# Patient Record
Sex: Female | Born: 1940
Health system: Southern US, Community
[De-identification: ages and names within clinical notes are randomized; demographics above are authoritative.]

## PROBLEM LIST (undated history)

## (undated) DIAGNOSIS — M069 Rheumatoid arthritis, unspecified: Secondary | ICD-10-CM

## (undated) HISTORY — DX: Rheumatoid arthritis, unspecified: M06.9

---

## 1972-07-10 HISTORY — PX: NASAL SEPTUM SURGERY: SHX37

## 1999-07-22 ENCOUNTER — Encounter: Admission: RE | Admit: 1999-07-22 | Discharge: 1999-07-22 | Payer: Self-pay | Admitting: Emergency Medicine

## 1999-07-22 ENCOUNTER — Encounter: Payer: Self-pay | Admitting: Emergency Medicine

## 2000-05-25 ENCOUNTER — Encounter: Admission: RE | Admit: 2000-05-25 | Discharge: 2000-06-28 | Payer: Self-pay | Admitting: Rheumatology

## 2000-08-24 ENCOUNTER — Encounter: Payer: Self-pay | Admitting: Family Medicine

## 2000-08-24 ENCOUNTER — Encounter: Admission: RE | Admit: 2000-08-24 | Discharge: 2000-08-24 | Payer: Self-pay | Admitting: Family Medicine

## 2001-08-16 ENCOUNTER — Encounter: Admission: RE | Admit: 2001-08-16 | Discharge: 2001-08-16 | Payer: Self-pay | Admitting: Rheumatology

## 2001-08-16 ENCOUNTER — Encounter: Payer: Self-pay | Admitting: Rheumatology

## 2001-09-03 ENCOUNTER — Encounter: Payer: Self-pay | Admitting: Family Medicine

## 2001-09-03 ENCOUNTER — Encounter: Admission: RE | Admit: 2001-09-03 | Discharge: 2001-09-03 | Payer: Self-pay | Admitting: *Deleted

## 2002-09-10 ENCOUNTER — Encounter: Admission: RE | Admit: 2002-09-10 | Discharge: 2002-09-10 | Payer: Self-pay | Admitting: Family Medicine

## 2002-09-10 ENCOUNTER — Encounter: Payer: Self-pay | Admitting: Family Medicine

## 2003-10-13 ENCOUNTER — Encounter: Admission: RE | Admit: 2003-10-13 | Discharge: 2003-10-13 | Payer: Self-pay | Admitting: Family Medicine

## 2004-11-04 ENCOUNTER — Encounter: Admission: RE | Admit: 2004-11-04 | Discharge: 2004-11-04 | Payer: Self-pay | Admitting: Family Medicine

## 2005-12-14 ENCOUNTER — Encounter: Admission: RE | Admit: 2005-12-14 | Discharge: 2005-12-14 | Payer: Self-pay | Admitting: Family Medicine

## 2006-12-13 ENCOUNTER — Ambulatory Visit (HOSPITAL_BASED_OUTPATIENT_CLINIC_OR_DEPARTMENT_OTHER): Admission: RE | Admit: 2006-12-13 | Discharge: 2006-12-13 | Payer: Self-pay | Admitting: Orthopedic Surgery

## 2007-01-16 ENCOUNTER — Encounter: Admission: RE | Admit: 2007-01-16 | Discharge: 2007-01-16 | Payer: Self-pay | Admitting: Family Medicine

## 2008-02-20 ENCOUNTER — Encounter: Admission: RE | Admit: 2008-02-20 | Discharge: 2008-02-20 | Payer: Self-pay | Admitting: Rheumatology

## 2008-03-31 ENCOUNTER — Encounter: Admission: RE | Admit: 2008-03-31 | Discharge: 2008-03-31 | Payer: Self-pay | Admitting: Family Medicine

## 2009-05-05 ENCOUNTER — Encounter: Admission: RE | Admit: 2009-05-05 | Discharge: 2009-05-05 | Payer: Self-pay | Admitting: Family Medicine

## 2010-05-19 ENCOUNTER — Encounter: Admission: RE | Admit: 2010-05-19 | Discharge: 2010-05-19 | Payer: Self-pay | Admitting: Family Medicine

## 2010-11-22 NOTE — Op Note (Signed)
Doris Orr, Doris Orr                  ACCOUNT NO.:  [id number]   MEDICAL RECORD NO.:  [id number]          PATIENT TYPE:  AMB   LOCATION:  DSC                          FACILITY:  [facility]   PHYSICIAN:  Cindee Salt, M.D.       DATE OF BIRTH:  Feb 10, 1941   DATE OF PROCEDURE:  12/13/2006  DATE OF DISCHARGE:                               OPERATIVE REPORT   PREOPERATIVE DIAGNOSIS:  Laceration of the left palm with numbness and  tingling, ring and little finger left hand.   POSTOPERATIVE DIAGNOSIS:  Laceration of the left palm with numbness and  tingling, ring and little finger left hand.   OPERATION:  Exploration neurolysis using the operative microscope with  Neuroflex wrap; common digital nerve middle, ring fingers; proper  digital nerve middle ring finger left hand.   SURGEON:  Cindee Salt, M.D.   ASSISTANT:  Carolyne Fiscal R.N.   ANESTHESIA:  General.   DATE OF OPERATION:  December 13, 2006.   HISTORY:  The patient is a 70 year old female who suffered a laceration  to her left hand from dog bite.  She has had progressive numbness and  tingling which has not abated.  The wound has healed.  She is admitted  now for exploration, possible repair, grafting neurolysis, common  digital nerve, proper digital nerve, middle ring finger left hand.  She  is aware of risks and complications including the possibility of  infection, recurrence, injury to arteries, nerves, tendons, incomplete  relief of symptoms, dystrophy.  She has elect to proceed to have this  done.   PROCEDURE:  The patient is brought to the operating room where a general  anesthetic was carried out without difficulty.  This was done after the  patient was seen in the preoperative area.  The extremity marked by both  the patient and surgeon, and an antibiotic given.  Once draped, the limb  was exsanguinated with an Esmarch bandage, tourniquet placed high on the  upper arm.  It was inflated to 150 mmHg.  The wound was then opened  using the  old scar, extended proximally and distally in a Brunner type  incision, carried down through subcutaneous tissue.  Bleeders were  electrocauterized.  The digital nerve was identified flexed distally.  With blunt and sharp dissection this was dissected free.  A tremendous  amount of scar was present about the nerve.  The operative microscope  was brought into position and the nerve was dissected free.  The digital  artery was found to be intact.  The digital nerve was found to be intact  without a laceration but significant scarring present about the entire  nerve.  The nerve was followed to the proper digital nerves to the  middle ring finger over approximately a 2-cm distance.  This was  entirely freed and an epineurolysis performed using the operative  microscope.  It was decided to wrap this due to the amount of scar  formation.  A Neuroflex tube was then selected.  This was measured be 4  mm.  A 4-mm tube was then selected.  This was then cut with 2 limbs  distally.  After irrigation of the wound, after soaking the neural tube,  this was then wrapped around the proper and common digital nerves.  It  was not sutured into position but with fibrin clot maintained its  circular nature.  Following this, the skin was then closed with  interrupted 5-0 Vicryl Rapide  sutures.  Sterile compressive dressing and splint was applied.  The  patient tolerated the procedure well.  On deflation of the tourniquet  all fingers immediately pinked.  She was taken to the recovery for  observation in satisfactory condition.  She will be discharged home to  return to the [facility] in 1 week.  On Vicodin.           ______________________________  Cindee Salt, M.D.     GK/MEDQ  D:  12/13/2006  T:  12/13/2006  Job:  [id number]   cc:   Lucila Maine

## 2011-04-27 LAB — POCT HEMOGLOBIN-HEMACUE
Hemoglobin: 13
Operator id: 128471

## 2011-05-22 ENCOUNTER — Other Ambulatory Visit: Payer: Self-pay | Admitting: Family Medicine

## 2011-05-22 DIAGNOSIS — Z1231 Encounter for screening mammogram for malignant neoplasm of breast: Secondary | ICD-10-CM

## 2011-06-27 ENCOUNTER — Ambulatory Visit: Payer: Self-pay

## 2011-06-29 ENCOUNTER — Ambulatory Visit
Admission: RE | Admit: 2011-06-29 | Discharge: 2011-06-29 | Disposition: A | Discharge status: Home / Self Care | Payer: Medicare Other | Source: Ambulatory Visit | Attending: Family Medicine | Admitting: Family Medicine

## 2011-06-29 DIAGNOSIS — Z1231 Encounter for screening mammogram for malignant neoplasm of breast: Secondary | ICD-10-CM

## 2012-06-21 ENCOUNTER — Other Ambulatory Visit: Payer: Self-pay | Admitting: Family Medicine

## 2012-06-21 DIAGNOSIS — Z1231 Encounter for screening mammogram for malignant neoplasm of breast: Secondary | ICD-10-CM

## 2012-07-29 ENCOUNTER — Ambulatory Visit
Admission: RE | Admit: 2012-07-29 | Discharge: 2012-07-29 | Disposition: A | Discharge status: Home / Self Care | Payer: Medicare Other | Source: Ambulatory Visit | Attending: Family Medicine | Admitting: Family Medicine

## 2012-07-29 DIAGNOSIS — Z1231 Encounter for screening mammogram for malignant neoplasm of breast: Secondary | ICD-10-CM

## 2012-07-31 ENCOUNTER — Ambulatory Visit: Payer: Medicare Other

## 2013-07-21 ENCOUNTER — Other Ambulatory Visit: Payer: Self-pay

## 2013-07-21 DIAGNOSIS — Z1231 Encounter for screening mammogram for malignant neoplasm of breast: Secondary | ICD-10-CM

## 2013-07-31 ENCOUNTER — Ambulatory Visit
Admission: RE | Admit: 2013-07-31 | Discharge: 2013-07-31 | Disposition: A | Discharge status: Home / Self Care | Payer: 59 | Source: Ambulatory Visit

## 2013-07-31 DIAGNOSIS — Z1231 Encounter for screening mammogram for malignant neoplasm of breast: Secondary | ICD-10-CM

## 2013-08-01 ENCOUNTER — Other Ambulatory Visit: Payer: Self-pay | Admitting: Family Medicine

## 2013-08-01 DIAGNOSIS — R928 Other abnormal and inconclusive findings on diagnostic imaging of breast: Secondary | ICD-10-CM

## 2013-08-12 ENCOUNTER — Ambulatory Visit
Admission: RE | Admit: 2013-08-12 | Discharge: 2013-08-12 | Disposition: A | Discharge status: Home / Self Care | Payer: 59 | Source: Ambulatory Visit | Attending: Family Medicine | Admitting: Family Medicine

## 2013-08-12 DIAGNOSIS — R928 Other abnormal and inconclusive findings on diagnostic imaging of breast: Secondary | ICD-10-CM

## 2013-12-25 ENCOUNTER — Other Ambulatory Visit: Payer: Self-pay | Admitting: Family Medicine

## 2013-12-25 DIAGNOSIS — R921 Mammographic calcification found on diagnostic imaging of breast: Secondary | ICD-10-CM

## 2014-02-07 DEATH — deceased

## 2014-02-11 ENCOUNTER — Ambulatory Visit
Admission: RE | Admit: 2014-02-11 | Discharge: 2014-02-11 | Disposition: A | Discharge status: Home / Self Care | Payer: 59 | Source: Ambulatory Visit | Attending: Family Medicine | Admitting: Family Medicine

## 2014-02-11 DIAGNOSIS — R921 Mammographic calcification found on diagnostic imaging of breast: Secondary | ICD-10-CM

## 2014-07-21 ENCOUNTER — Other Ambulatory Visit: Payer: Self-pay | Admitting: Family Medicine

## 2014-07-21 DIAGNOSIS — R921 Mammographic calcification found on diagnostic imaging of breast: Secondary | ICD-10-CM

## 2014-08-19 ENCOUNTER — Ambulatory Visit
Admission: RE | Admit: 2014-08-19 | Discharge: 2014-08-19 | Disposition: A | Discharge status: Home / Self Care | Payer: Medicare Other | Source: Ambulatory Visit | Attending: Family Medicine | Admitting: Family Medicine

## 2014-08-19 DIAGNOSIS — R921 Mammographic calcification found on diagnostic imaging of breast: Secondary | ICD-10-CM

## 2014-11-16 LAB — PULMONARY FUNCTION TEST

## 2014-12-04 ENCOUNTER — Encounter: Payer: Self-pay | Admitting: Internal Medicine

## 2014-12-04 ENCOUNTER — Ambulatory Visit (INDEPENDENT_AMBULATORY_CARE_PROVIDER_SITE_OTHER): Payer: Medicare Other | Admitting: Internal Medicine

## 2014-12-04 ENCOUNTER — Encounter (INDEPENDENT_AMBULATORY_CARE_PROVIDER_SITE_OTHER): Payer: Self-pay

## 2014-12-04 VITALS — BP 122/74 | HR 89 | Ht 66.0 in | Wt 164.0 lb

## 2014-12-04 DIAGNOSIS — R05 Cough: Secondary | ICD-10-CM | POA: Diagnosis not present

## 2014-12-04 DIAGNOSIS — M0510 Rheumatoid lung disease with rheumatoid arthritis of unspecified site: Secondary | ICD-10-CM | POA: Diagnosis not present

## 2014-12-04 DIAGNOSIS — J4530 Mild persistent asthma, uncomplicated: Secondary | ICD-10-CM | POA: Diagnosis not present

## 2014-12-04 DIAGNOSIS — R059 Cough, unspecified: Secondary | ICD-10-CM

## 2014-12-04 NOTE — Progress Notes (Signed)
Subjective:    Patient ID: Doris ShearerRuth B Orr, female    DOB: 10-Nov-1940,    MRN: [id number]  HPI  [age] yowf never regular smoker with seasonal allergic rhinitis since early 80's prev on shots but no inhalers  dx with RA 1998 f/u by Tr[facility]low on mtx added around 2014 and newly dx'd as asthma by Dr Lorin PicketScott 11/16/14 rx with symbicort and referred 12/04/2014 to pulmonary clinic.   12/04/2014 1st [facility] office visit/ Frenchie Pribyl   Chief Complaint  Patient presents with  . Pulmonary Consult    Referred by Dr. Lucila Maineobert Scott. Pt c/o wheezing, SOB and cough for the past 3 months. She states that it started out with sinus problems. Her cough is non prod. She is SOB walking approx 10 steps.  has prednisone uses about every 3 months for RA flare > takes  just a few pills x a few days and jittery so minimizes it Feels symbicort helps but also causes jitteriness  Walking a mile slow pace ok but doe some worse on hills   No obvious day to day or daytime variabilty or assoc cp or chest tightness,  overt sinus or hb symptoms. No unusual exp hx or h/o childhood pna/ asthma or knowledge of premature birth.  Sleeping ok without nocturnal  or early am exacerbation  of respiratory  c/o's or need for noct saba. Also denies any obvious fluctuation of symptoms with weather or environmental changes or other aggravating or alleviating factors except as outlined above   Current Medications, Allergies, Complete Past Medical History, Past Surgical History, Family History, and Social History were reviewed in [facility] electronic medical record.           Review of Systems  Constitutional: Negative for fever, chills and unexpected weight change.  HENT: Positive for congestion and sneezing. Negative for dental problem, ear pain, nosebleeds, postnasal drip, rhinorrhea, sinus pressure, sore throat, trouble swallowing and voice change.   Eyes: Negative for visual disturbance.  Respiratory: Positive for cough and shortness  of breath. Negative for choking.   Cardiovascular: Negative for chest pain and leg swelling.  Gastrointestinal: Positive for abdominal pain. Negative for vomiting and diarrhea.  Genitourinary: Negative for difficulty urinating.       Acid heartburn Indigestion  Musculoskeletal: Positive for arthralgias.  Skin: Negative for rash.  Neurological: Positive for headaches. Negative for tremors and syncope.  Hematological: Does not bruise/bleed easily.       Objective:   Physical Exam   amb pleasant wf nad  Wt Readings from Last 3 Encounters:  12/04/14 164 lb (74.39 kg)    Vital signs reviewed   HEENT: nl dentition, turbinates, and orophanx. Nl external ear canals without cough reflex   NECK :  without JVD/Nodes/TM/ nl carotid upstrokes bilaterally   LUNGS: no acc muscle use, clear to A and P bilaterally without cough on insp or exp maneuvers   CV:  RRR  no s3 or murmur or increase in P2, no edema   ABD:  soft and nontender with nl excursion in the supine position. No bruits or organomegaly, bowel sounds nl  MS:  warm without deformities, calf tenderness, cyanosis or clubbing  SKIN: warm and dry without lesions    NEURO:  alert, approp, no deficits      I personally reviewed images and agree with radiology impression as follows:  CXR:  Nov 16 2014 Hyperexpansion, min ild vs prev study 04/28/13         Assessment &  Plan:

## 2014-12-04 NOTE — Patient Instructions (Addendum)
Try symbicort 160 just  one twice daily   Work on inhaler technique:  relax and gently blow all the way out then take a nice smooth deep breath back in, triggering the inhaler at same time you start breathing in.  Hold for up to 5 seconds if you can.  Rinse and gargle with water when done     Try pepcid 20 mg at bedtime  GERD (REFLUX)  is an extremely common cause of respiratory symptoms just like yours , many times with no obvious heartburn at all.    It can be treated with medication, but also with lifestyle changes including avoidance of late meals, elevation of the head of your bed (ideally with 6 inch  bed blocks) excessive alcohol, smoking cessation, and avoid fatty foods, chocolate, peppermint, colas, red wine, and acidic juices such as orange juice.  NO MINT OR MENTHOL PRODUCTS SO NO COUGH DROPS  USE SUGARLESS CANDY INSTEAD (Jolley ranchers or Stover's or Life Savers) or even ice chips will also do - the key is to swallow to prevent all throat clearing. NO OIL BASED VITAMINS - use powdered substitutes.   Please schedule a follow up office visit in 6 weeks, call sooner if needed with full pft and no symbicort that am

## 2014-12-06 ENCOUNTER — Encounter: Payer: Self-pay | Admitting: Internal Medicine

## 2014-12-06 DIAGNOSIS — J4530 Mild persistent asthma, uncomplicated: Secondary | ICD-10-CM | POA: Insufficient documentation

## 2014-12-06 DIAGNOSIS — M0510 Rheumatoid lung disease with rheumatoid arthritis of unspecified site: Secondary | ICD-10-CM | POA: Insufficient documentation

## 2014-12-06 DIAGNOSIS — R05 Cough: Secondary | ICD-10-CM | POA: Insufficient documentation

## 2014-12-06 DIAGNOSIS — R059 Cough, unspecified: Secondary | ICD-10-CM | POA: Insufficient documentation

## 2014-12-06 NOTE — Assessment & Plan Note (Signed)
DDX of  difficult airways management all start with A and  include Adherence, Ace Inhibitors, Acid Reflux, Active Sinus Disease, Alpha 1 Antitripsin deficiency, Anxiety masquerading as Airways dz,  ABPA,  allergy(esp in young), Aspiration (esp in elderly), Adverse effects of DPI,  Active smokers, plus two Bs  = Bronchiectasis and Beta blocker use..and one C= CHF   Adherence is always the initial "prime suspect" and is a multilayered concern that requires a "trust but verify" approach in every patient - starting with knowing how to use medications, especially inhalers, correctly, keeping up with refills and understanding the fundamental difference between maintenance and prns vs those medications only taken for a very short course and then stopped and not refilled.  - The proper method of use, as well as anticipated side effects, of a metered-dose inhaler are discussed and demonstrated to the patient. Improved effectiveness after extensive coaching during this visit to a level of approximately  75% > try symbicort 160 one bid before returns for pfts   ? Acid (or non-acid) GERD > always difficult to exclude as up to 75% of pts in some series report no assoc GI/ Heartburn symptoms> rec max (24h)  acid suppression and diet restrictions/ reviewed and instructions given in writing.

## 2014-12-06 NOTE — Assessment & Plan Note (Signed)
DDx for pulmonary fibrosis  includes idiopathic pulmonary fibrosis, pulmonary fibrosis associated with rheumatologic diseases (which have a relatively benign course in most cases) , adverse effect from  drugs such as chemotherapy (like methotrexate) or amiodarone exposure, nonspecific interstitial pneumonia which is typically steroid responsive, and chronic hypersensitivity pneumonitis.   In active  smokers Langerhan's Cell  Histiocyctosis (eosinophilic granuomatosis),  DIP,  and Respiratory Bronchiolitis ILD also need to be considered,  But do not apply here by her hx  Discussed with her at length how difficult it is to tell some RA diseases from the meds used to treat them but the general rule is the better controlled the  arthritis is the less likely we're dealing with RA lung - her RA requires her to take prednisone intermittently so it's not under great control on mtx.  However, whatever is causing the increased pf on cxr, it's not that bad presently and Use of PPI is associated with improved survival time and with decreased radiologic fibrosis per King's study published in [facility] vol 184 p1390.  Dec 2011  This may not be cause and effect, but given how universally unimpressive and expensive  all the other  Drugs developed to day  have been for pf,   rec start  rx max acid suppression  / diet/ lifestyle modification and f/u with serial walking sats and lung volumes for now to put more points on the curve / establish firm baseline before considering additional measures.

## 2014-12-06 NOTE — Assessment & Plan Note (Signed)
Has non specific features so could be due to airways dz(asthma or RA airways dz) or ILD or unrelated (due to gerd)   rx for gerd and airways dz and follow for now  See instructions for specific recommendations which were reviewed directly with the patient who was given a copy with highlighter outlining the key components.

## 2015-01-04 ENCOUNTER — Other Ambulatory Visit: Payer: Self-pay

## 2015-01-15 ENCOUNTER — Encounter: Payer: Self-pay | Admitting: Internal Medicine

## 2015-01-15 ENCOUNTER — Ambulatory Visit (INDEPENDENT_AMBULATORY_CARE_PROVIDER_SITE_OTHER): Payer: Medicare Other | Admitting: Internal Medicine

## 2015-01-15 ENCOUNTER — Ambulatory Visit (INDEPENDENT_AMBULATORY_CARE_PROVIDER_SITE_OTHER)
Admission: RE | Admit: 2015-01-15 | Discharge: 2015-01-15 | Disposition: A | Discharge status: Home / Self Care | Payer: Medicare Other | Source: Ambulatory Visit | Attending: Internal Medicine | Admitting: Internal Medicine

## 2015-01-15 VITALS — BP 118/80 | HR 86 | Ht 67.0 in | Wt 167.0 lb

## 2015-01-15 DIAGNOSIS — R059 Cough, unspecified: Secondary | ICD-10-CM

## 2015-01-15 DIAGNOSIS — R05 Cough: Secondary | ICD-10-CM

## 2015-01-15 DIAGNOSIS — J4530 Mild persistent asthma, uncomplicated: Secondary | ICD-10-CM

## 2015-01-15 DIAGNOSIS — M0510 Rheumatoid lung disease with rheumatoid arthritis of unspecified site: Secondary | ICD-10-CM

## 2015-01-15 LAB — PULMONARY FUNCTION TEST
DL/VA % pred: 91 %
DL/VA: 4.7 ml/min/mmHg/L
DLCO UNC % PRED: 69 %
DLCO UNC: 19.73 ml/min/mmHg
FEF 25-75 POST: 2.03 L/s
FEF 25-75 Pre: 1.6 L/sec
FEF2575-%Change-Post: 26 %
FEF2575-%Pred-Post: 101 %
FEF2575-%Pred-Pre: 80 %
FEV1-%CHANGE-POST: 4 %
FEV1-%PRED-PRE: 81 %
FEV1-%Pred-Post: 85 %
FEV1-Post: 2.13 L
FEV1-Pre: 2.04 L
FEV1FVC-%Change-Post: 8 %
FEV1FVC-%Pred-Pre: 99 %
FEV6-%Change-Post: -4 %
FEV6-%Pred-Post: 81 %
FEV6-%Pred-Pre: 84 %
FEV6-PRE: 2.69 L
FEV6-Post: 2.57 L
FEV6FVC-%CHANGE-POST: 0 %
FEV6FVC-%Pred-Post: 103 %
FEV6FVC-%Pred-Pre: 104 %
FVC-%Change-Post: -3 %
FVC-%Pred-Post: 78 %
FVC-%Pred-Pre: 82 %
FVC-POST: 2.61 L
FVC-Pre: 2.72 L
POST FEV1/FVC RATIO: 82 %
Post FEV6/FVC ratio: 99 %
Pre FEV1/FVC ratio: 75 %
Pre FEV6/FVC Ratio: 99 %
RV % PRED: 103 %
RV: 2.46 L
TLC % PRED: 94 %
TLC: 5.2 L

## 2015-01-15 NOTE — Progress Notes (Signed)
Subjective:    Patient ID: Doris Orr, female    DOB: 1940-08-24,    MRN: [id number]    Brief patient profile:  11 yowf never regular smoker with seasonal allergic rhinitis since early 80's prev on shots but no inhalers  dx with RA 1998 f/u by [facility] on mtx added around 2014 and newly dx'd as asthma by Dr Lorin Picket 11/16/14 rx with symbicort and referred 12/04/2014 to pulmonary clinic. Nl pfts 01/15/15 except for dlco 69%     History of Present Illness  12/04/2014 1st  Pulmonary office visit/ Ithan Touhey   Chief Complaint  Patient presents with  . Pulmonary Consult    Referred by Dr. Lucila Maine. Pt c/o wheezing, SOB and cough for the past 3 months. She states that it started out with sinus problems. Her cough is non prod. She is SOB walking approx 10 steps.  has prednisone uses about every 3 months for RA flare > takes  just a few pills x a few days and jittery so minimizes it Feels symbicort helps but also causes jitteriness rec Try symbicort 160 just  one twice daily  Work on inhaler technique: Try pepcid 20 mg at bedtime GERD diet    01/15/2015 f/u ov/Kofi Murrell re: RA/ on mtx/ ? Asthma but no longer using symbicort makes her too shaky even at one puff bid/ no need for saba  Chief Complaint  Patient presents with  . Follow-up    PFT done today. Pt states that her cough has improved some. She states her SOB has resolved. She states that she can still "feel a little rattle in chest" when she takes a deep breath.   Not limited by breathing from desired activities  Walks around the property and some hiking in [place] s sob    No obvious day to day or daytime variability or assoc chronic cough or cp or chest tightness, subjective wheeze or overt sinus or hb symptoms. No unusual exp hx or h/o childhood pna/ asthma or knowledge of premature birth.  Sleeping ok without nocturnal  or early am exacerbation  of respiratory  c/o's or need for noct saba. Also denies any obvious fluctuation of symptoms  with weather or environmental changes or other aggravating or alleviating factors except as outlined above   Current Medications, Allergies, Complete Past Medical History, Past Surgical History, Family History, and Social History were reviewed in [employer] record.  ROS  The following are not active complaints unless bolded sore throat, dysphagia, dental problems, itching, sneezing,  nasal congestion or excess/ purulent secretions, ear ache,   fever, chills, sweats, unintended wt loss, classically pleuritic or exertional cp, hemoptysis,  orthopnea pnd or leg swelling, presyncope, palpitations, abdominal pain, anorexia, nausea, vomiting, diarrhea  or change in bowel or bladder habits, change in stools or urine, dysuria,hematuria,  rash, arthralgias, visual complaints, headache, numbness, weakness or ataxia or problems with walking or coordination,  change in mood/affect or memory.             Objective:   Physical Exam   amb pleasant wf nad   Wt Readings from Last 3 Encounters:  01/15/15 167 lb (75.751 kg)  12/04/14 164 lb (74.39 kg)    Vital signs reviewed    HEENT: nl dentition, turbinates, and orophanx. Nl external ear canals without cough reflex   NECK :  without JVD/Nodes/TM/ nl carotid upstrokes bilaterally   LUNGS: no acc muscle use, clear to A and P bilaterally without cough on  insp or exp maneuvers   CV:  RRR  no s3 or murmur or increase in P2, no edema   ABD:  soft and nontender with nl excursion in the supine position. No bruits or organomegaly, bowel sounds nl  MS:  warm without deformities, calf tenderness, cyanosis or clubbing  SKIN: warm and dry without lesions    NEURO:  alert, approp, no deficits      I personally reviewed images and agree with radiology impression as follows:  CXR: 01/15/2015  Mediastinum and hilar structures are normal. Lungs are clear. Heart size normal. No pleural effusion or pneumothorax. No acute  bony abnormality. Degenerative changes thoracic spine.    Assessment & Plan:

## 2015-01-15 NOTE — Progress Notes (Signed)
PFT done today. 

## 2015-01-15 NOTE — Patient Instructions (Addendum)
Proair one - two puffs up to twice weekly - if you need more then we need to regroup here  Please remember to go to the  x-ray department downstairs for your tests - we will call you with the results when they are available.  Dr Kellie Simmeringruslow may want you to have yearly pfts but unless you feel you are losing ground in your exercise tolerance these are optional from my perspective

## 2015-01-16 ENCOUNTER — Encounter: Payer: Self-pay | Admitting: Internal Medicine

## 2015-01-16 NOTE — Assessment & Plan Note (Signed)
-  11/16/14 spirometry fev1 1.55 / fvc 2.61 with ratio 59.5 and 19% better p saba  but non physiologic f/v curve  - 12/04/2014 p extensive coaching HFA effectiveness =    75% from a baseline of 25% > try symbicort 160 one bid as tol 2 bid poorly  - PFTs  01/15/15 s airflow obst off all rx (could not tol even symb one bid due to shakes)  All goals of chronic asthma control met including optimal function and elimination of symptoms with minimal need for rescue therapy.  Contingencies discussed in full including contacting this office immediately if not controlling the symptoms using the rule of two's.

## 2015-01-16 NOTE — Assessment & Plan Note (Addendum)
-   PFTs 01/15/15  VC 2.75 (83%) with dlco 69 corrects to 91% for alv vol   I had an extended final summary discussion with the patient reviewing all relevant studies completed to date and  lasting 15 to 20 minutes of a 25 minute visit on the following issues:    1) no evidence at this point for any form of pulmonary dz related to RA, neither bronchiolitis nor ILD though Doris Orr is at risk  2) no evidence of mtx toxicity  3) as long as maintaining ex tol as is then having established this correlates with nl pfts all Doris Orr needs to do is stay active and f/u prn loss of ex tol  4) Each maintenance medication was reviewed in detail including most importantly the difference between maintenance and as needed and under what circumstances the prns are to be used.  Please see instructions for details which were reviewed in writing and the patient given a copy.    Pulmonary f/u is prn

## 2015-08-02 ENCOUNTER — Other Ambulatory Visit: Payer: Self-pay | Admitting: Family Medicine

## 2015-08-02 ENCOUNTER — Other Ambulatory Visit: Payer: Self-pay

## 2015-08-02 DIAGNOSIS — R920 Mammographic microcalcification found on diagnostic imaging of breast: Secondary | ICD-10-CM

## 2015-08-25 ENCOUNTER — Ambulatory Visit
Admission: RE | Admit: 2015-08-25 | Discharge: 2015-08-25 | Disposition: A | Discharge status: Home / Self Care | Payer: Medicare Other | Source: Ambulatory Visit | Attending: Family Medicine | Admitting: Family Medicine

## 2015-08-25 ENCOUNTER — Other Ambulatory Visit: Payer: Self-pay | Admitting: Family Medicine

## 2015-08-25 DIAGNOSIS — R920 Mammographic microcalcification found on diagnostic imaging of breast: Secondary | ICD-10-CM

## 2016-07-07 IMAGING — CR DG CHEST 2V
2 series · 2 of 2 positions shown · non-contrast
Comparison: None.

CLINICAL DATA: Rheumatoid arthritis.

EXAM:
CHEST  2 VIEW

[view not recorded (1 of 2)]
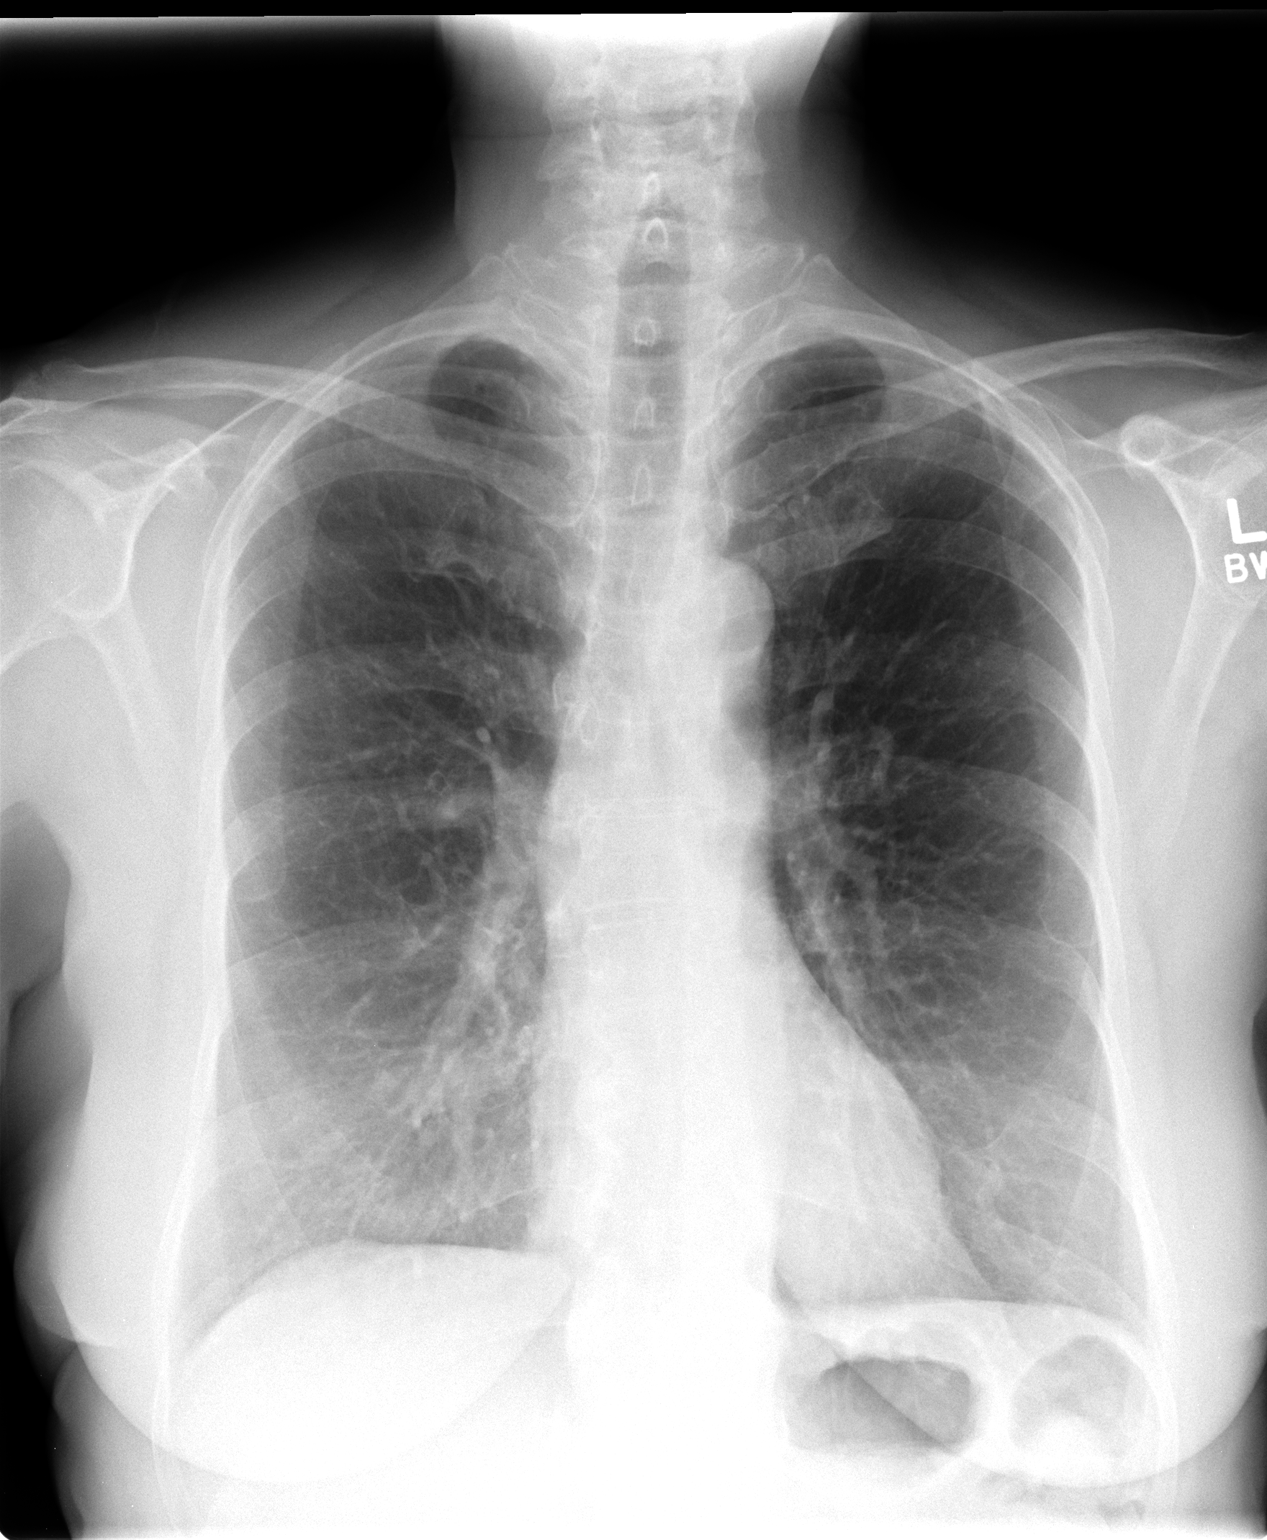

[view not recorded (2 of 2)]
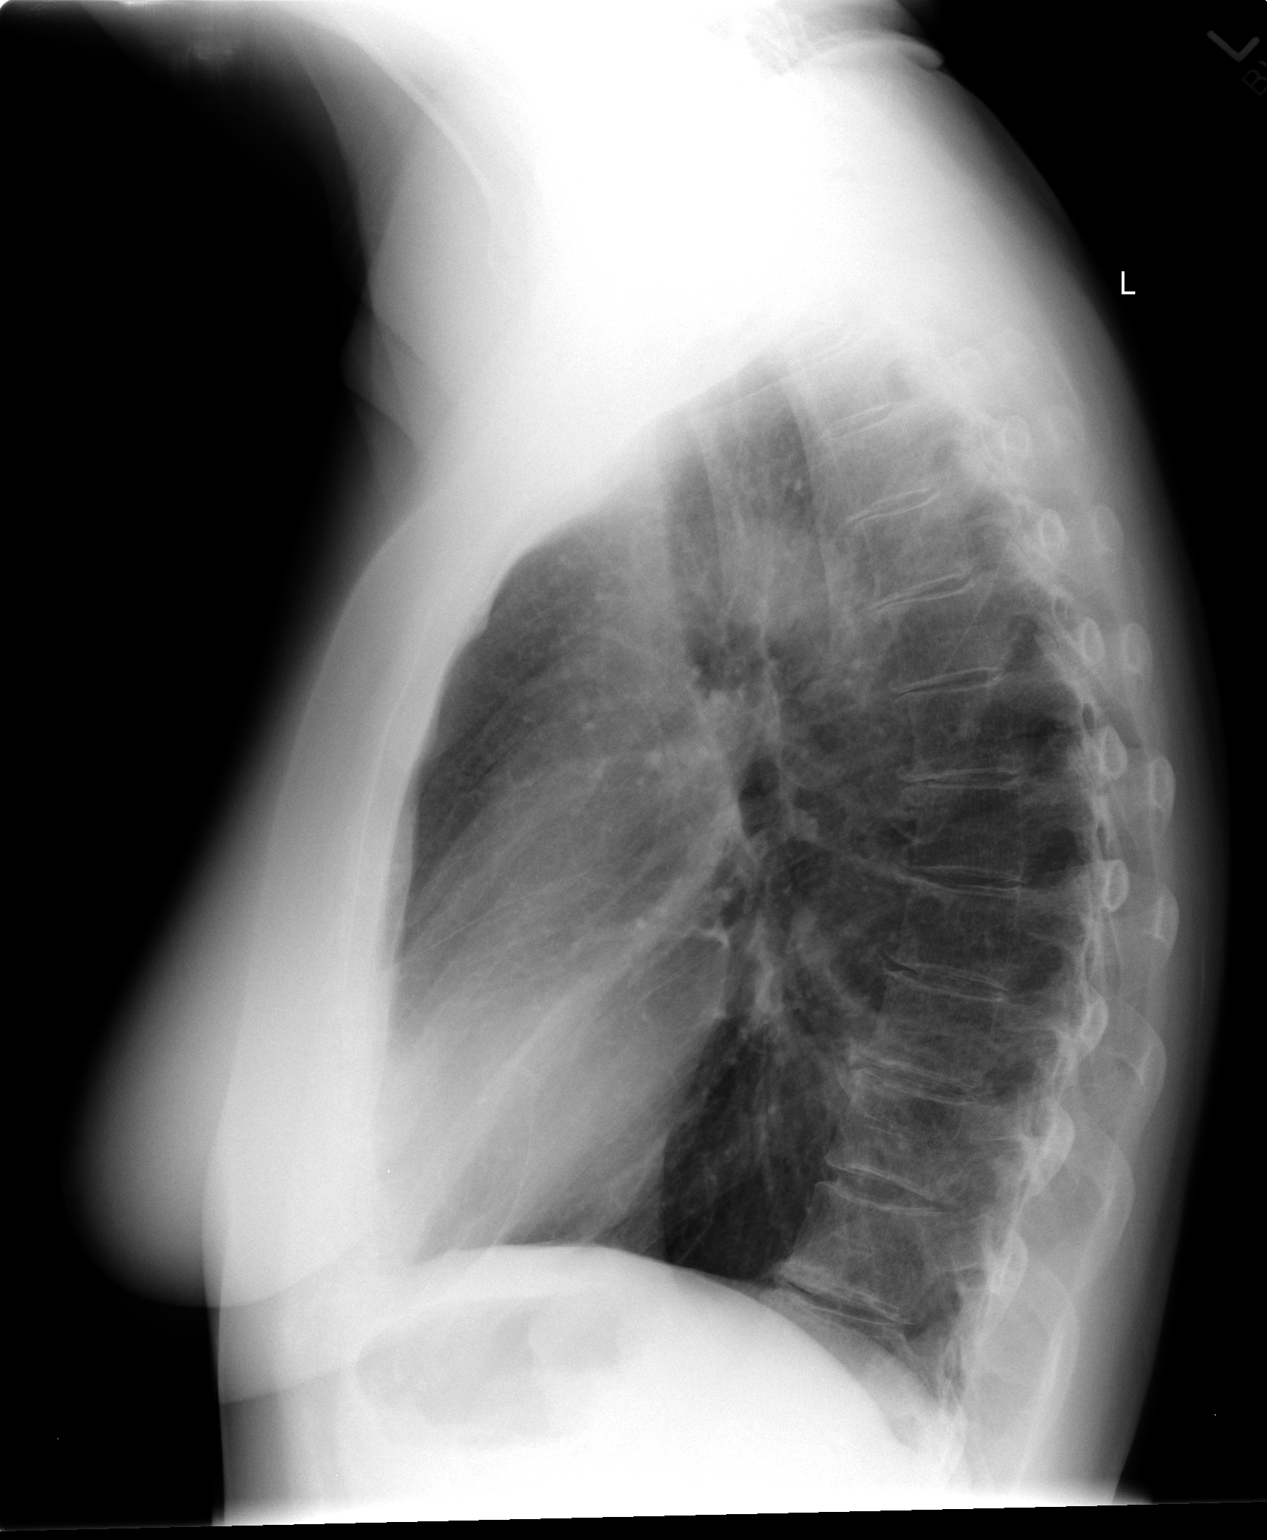

[2 of 2 positions shown; findings below may reference images not displayed]

FINDINGS: Mediastinum and hilar structures are normal. Lungs are clear. Heart
size normal. No pleural effusion or pneumothorax. No acute bony
abnormality. Degenerative changes thoracic spine.
IMPRESSION: No acute cardiopulmonary disease.

## 2016-07-20 ENCOUNTER — Other Ambulatory Visit: Payer: Self-pay | Admitting: Family Medicine

## 2016-07-20 DIAGNOSIS — Z1231 Encounter for screening mammogram for malignant neoplasm of breast: Secondary | ICD-10-CM

## 2016-08-29 ENCOUNTER — Ambulatory Visit
Admission: RE | Admit: 2016-08-29 | Discharge: 2016-08-29 | Disposition: A | Discharge status: Home / Self Care | Payer: Medicare Other | Source: Ambulatory Visit | Attending: Family Medicine | Admitting: Family Medicine

## 2016-08-29 DIAGNOSIS — Z1231 Encounter for screening mammogram for malignant neoplasm of breast: Secondary | ICD-10-CM

## 2017-08-06 ENCOUNTER — Other Ambulatory Visit: Payer: Self-pay | Admitting: Family Medicine

## 2017-08-06 DIAGNOSIS — Z139 Encounter for screening, unspecified: Secondary | ICD-10-CM

## 2017-08-30 ENCOUNTER — Ambulatory Visit
Admission: RE | Admit: 2017-08-30 | Discharge: 2017-08-30 | Disposition: A | Discharge status: Home / Self Care | Payer: Medicare Other | Source: Ambulatory Visit | Attending: Family Medicine | Admitting: Family Medicine

## 2017-08-30 DIAGNOSIS — Z139 Encounter for screening, unspecified: Secondary | ICD-10-CM

## 2018-07-31 ENCOUNTER — Other Ambulatory Visit: Payer: Self-pay | Admitting: Family Medicine

## 2018-07-31 DIAGNOSIS — Z1231 Encounter for screening mammogram for malignant neoplasm of breast: Secondary | ICD-10-CM

## 2018-09-03 ENCOUNTER — Ambulatory Visit
Admission: RE | Admit: 2018-09-03 | Discharge: 2018-09-03 | Disposition: A | Discharge status: Home / Self Care | Payer: Medicare Other | Source: Ambulatory Visit | Attending: Family Medicine | Admitting: Family Medicine

## 2018-09-03 DIAGNOSIS — Z1231 Encounter for screening mammogram for malignant neoplasm of breast: Secondary | ICD-10-CM

## 2019-07-15 DIAGNOSIS — E663 Overweight: Secondary | ICD-10-CM | POA: Diagnosis not present

## 2019-07-15 DIAGNOSIS — M150 Primary generalized (osteo)arthritis: Secondary | ICD-10-CM | POA: Diagnosis not present

## 2019-07-15 DIAGNOSIS — Z6825 Body mass index (BMI) 25.0-25.9, adult: Secondary | ICD-10-CM | POA: Diagnosis not present

## 2019-07-15 DIAGNOSIS — M25562 Pain in left knee: Secondary | ICD-10-CM | POA: Diagnosis not present

## 2019-07-15 DIAGNOSIS — I7300 Raynaud's syndrome without gangrene: Secondary | ICD-10-CM | POA: Diagnosis not present

## 2019-07-15 DIAGNOSIS — R911 Solitary pulmonary nodule: Secondary | ICD-10-CM | POA: Diagnosis not present

## 2019-07-15 DIAGNOSIS — M0579 Rheumatoid arthritis with rheumatoid factor of multiple sites without organ or systems involvement: Secondary | ICD-10-CM | POA: Diagnosis not present

## 2019-07-24 DIAGNOSIS — N3010 Interstitial cystitis (chronic) without hematuria: Secondary | ICD-10-CM | POA: Diagnosis not present

## 2019-07-30 DIAGNOSIS — F332 Major depressive disorder, recurrent severe without psychotic features: Secondary | ICD-10-CM | POA: Diagnosis not present

## 2019-07-30 DIAGNOSIS — R4189 Other symptoms and signs involving cognitive functions and awareness: Secondary | ICD-10-CM | POA: Diagnosis not present

## 2019-07-31 ENCOUNTER — Other Ambulatory Visit: Payer: Self-pay | Admitting: Family Medicine

## 2019-07-31 DIAGNOSIS — Z1231 Encounter for screening mammogram for malignant neoplasm of breast: Secondary | ICD-10-CM

## 2019-08-01 DIAGNOSIS — N3010 Interstitial cystitis (chronic) without hematuria: Secondary | ICD-10-CM | POA: Diagnosis not present

## 2019-08-04 DIAGNOSIS — F29 Unspecified psychosis not due to a substance or known physiological condition: Secondary | ICD-10-CM | POA: Diagnosis not present

## 2019-08-04 DIAGNOSIS — R443 Hallucinations, unspecified: Secondary | ICD-10-CM | POA: Diagnosis not present

## 2019-08-06 DIAGNOSIS — F332 Major depressive disorder, recurrent severe without psychotic features: Secondary | ICD-10-CM | POA: Diagnosis not present

## 2019-08-06 DIAGNOSIS — R4189 Other symptoms and signs involving cognitive functions and awareness: Secondary | ICD-10-CM | POA: Diagnosis not present

## 2019-08-07 DIAGNOSIS — N3010 Interstitial cystitis (chronic) without hematuria: Secondary | ICD-10-CM | POA: Diagnosis not present

## 2019-08-08 DIAGNOSIS — R4189 Other symptoms and signs involving cognitive functions and awareness: Secondary | ICD-10-CM | POA: Diagnosis not present

## 2019-08-08 DIAGNOSIS — F332 Major depressive disorder, recurrent severe without psychotic features: Secondary | ICD-10-CM | POA: Diagnosis not present

## 2019-08-14 DIAGNOSIS — N3010 Interstitial cystitis (chronic) without hematuria: Secondary | ICD-10-CM | POA: Diagnosis not present

## 2019-08-19 DIAGNOSIS — F332 Major depressive disorder, recurrent severe without psychotic features: Secondary | ICD-10-CM | POA: Diagnosis not present

## 2019-08-25 DIAGNOSIS — R41844 Frontal lobe and executive function deficit: Secondary | ICD-10-CM | POA: Diagnosis not present

## 2019-08-25 DIAGNOSIS — Z712 Person consulting for explanation of examination or test findings: Secondary | ICD-10-CM | POA: Diagnosis not present

## 2019-08-25 DIAGNOSIS — F329 Major depressive disorder, single episode, unspecified: Secondary | ICD-10-CM | POA: Diagnosis not present

## 2019-08-25 DIAGNOSIS — R41842 Visuospatial deficit: Secondary | ICD-10-CM | POA: Diagnosis not present

## 2019-08-25 DIAGNOSIS — R279 Unspecified lack of coordination: Secondary | ICD-10-CM | POA: Diagnosis not present

## 2019-08-26 DIAGNOSIS — F332 Major depressive disorder, recurrent severe without psychotic features: Secondary | ICD-10-CM | POA: Diagnosis not present

## 2019-09-04 DIAGNOSIS — N3010 Interstitial cystitis (chronic) without hematuria: Secondary | ICD-10-CM | POA: Diagnosis not present

## 2019-09-09 ENCOUNTER — Ambulatory Visit
Admission: RE | Admit: 2019-09-09 | Discharge: 2019-09-09 | Disposition: A | Discharge status: Home / Self Care | Payer: Medicare PPO | Source: Ambulatory Visit | Attending: Family Medicine | Admitting: Family Medicine

## 2019-09-09 ENCOUNTER — Other Ambulatory Visit: Payer: Self-pay

## 2019-09-09 DIAGNOSIS — Z1231 Encounter for screening mammogram for malignant neoplasm of breast: Secondary | ICD-10-CM

## 2019-09-18 DIAGNOSIS — F332 Major depressive disorder, recurrent severe without psychotic features: Secondary | ICD-10-CM | POA: Diagnosis not present

## 2019-10-02 DIAGNOSIS — F341 Dysthymic disorder: Secondary | ICD-10-CM | POA: Diagnosis not present

## 2019-10-08 DIAGNOSIS — Z79899 Other long term (current) drug therapy: Secondary | ICD-10-CM | POA: Diagnosis not present

## 2019-10-08 DIAGNOSIS — E782 Mixed hyperlipidemia: Secondary | ICD-10-CM | POA: Diagnosis not present

## 2019-10-15 DIAGNOSIS — R52 Pain, unspecified: Secondary | ICD-10-CM | POA: Diagnosis not present

## 2019-10-15 DIAGNOSIS — K589 Irritable bowel syndrome without diarrhea: Secondary | ICD-10-CM | POA: Diagnosis not present

## 2019-10-15 DIAGNOSIS — E782 Mixed hyperlipidemia: Secondary | ICD-10-CM | POA: Diagnosis not present

## 2019-10-15 DIAGNOSIS — K219 Gastro-esophageal reflux disease without esophagitis: Secondary | ICD-10-CM | POA: Diagnosis not present

## 2019-10-15 DIAGNOSIS — M1712 Unilateral primary osteoarthritis, left knee: Secondary | ICD-10-CM | POA: Diagnosis not present

## 2019-10-15 DIAGNOSIS — F329 Major depressive disorder, single episode, unspecified: Secondary | ICD-10-CM | POA: Diagnosis not present

## 2019-10-15 DIAGNOSIS — Z139 Encounter for screening, unspecified: Secondary | ICD-10-CM | POA: Diagnosis not present

## 2019-10-15 DIAGNOSIS — Z0000 Encounter for general adult medical examination without abnormal findings: Secondary | ICD-10-CM | POA: Diagnosis not present

## 2019-10-15 DIAGNOSIS — M85852 Other specified disorders of bone density and structure, left thigh: Secondary | ICD-10-CM | POA: Diagnosis not present

## 2019-10-16 DIAGNOSIS — F339 Major depressive disorder, recurrent, unspecified: Secondary | ICD-10-CM | POA: Diagnosis not present

## 2019-11-05 DIAGNOSIS — F332 Major depressive disorder, recurrent severe without psychotic features: Secondary | ICD-10-CM | POA: Diagnosis not present

## 2019-11-12 DIAGNOSIS — F3132 Bipolar disorder, current episode depressed, moderate: Secondary | ICD-10-CM | POA: Diagnosis not present

## 2019-11-12 DIAGNOSIS — Z79899 Other long term (current) drug therapy: Secondary | ICD-10-CM | POA: Diagnosis not present

## 2019-11-21 DIAGNOSIS — M0579 Rheumatoid arthritis with rheumatoid factor of multiple sites without organ or systems involvement: Secondary | ICD-10-CM | POA: Diagnosis not present

## 2019-11-21 DIAGNOSIS — M25562 Pain in left knee: Secondary | ICD-10-CM | POA: Diagnosis not present

## 2019-11-21 DIAGNOSIS — M150 Primary generalized (osteo)arthritis: Secondary | ICD-10-CM | POA: Diagnosis not present

## 2019-11-21 DIAGNOSIS — Z6824 Body mass index (BMI) 24.0-24.9, adult: Secondary | ICD-10-CM | POA: Diagnosis not present

## 2019-11-21 DIAGNOSIS — I7300 Raynaud's syndrome without gangrene: Secondary | ICD-10-CM | POA: Diagnosis not present

## 2019-11-25 DIAGNOSIS — F330 Major depressive disorder, recurrent, mild: Secondary | ICD-10-CM | POA: Diagnosis not present

## 2019-12-11 DIAGNOSIS — Z79899 Other long term (current) drug therapy: Secondary | ICD-10-CM | POA: Diagnosis not present

## 2019-12-25 DIAGNOSIS — F3132 Bipolar disorder, current episode depressed, moderate: Secondary | ICD-10-CM | POA: Diagnosis not present

## 2020-01-13 DIAGNOSIS — I7300 Raynaud's syndrome without gangrene: Secondary | ICD-10-CM | POA: Diagnosis not present

## 2020-01-13 DIAGNOSIS — M25562 Pain in left knee: Secondary | ICD-10-CM | POA: Diagnosis not present

## 2020-01-13 DIAGNOSIS — Z6824 Body mass index (BMI) 24.0-24.9, adult: Secondary | ICD-10-CM | POA: Diagnosis not present

## 2020-01-13 DIAGNOSIS — M150 Primary generalized (osteo)arthritis: Secondary | ICD-10-CM | POA: Diagnosis not present

## 2020-01-13 DIAGNOSIS — M0579 Rheumatoid arthritis with rheumatoid factor of multiple sites without organ or systems involvement: Secondary | ICD-10-CM | POA: Diagnosis not present

## 2020-01-13 DIAGNOSIS — N3010 Interstitial cystitis (chronic) without hematuria: Secondary | ICD-10-CM | POA: Diagnosis not present

## 2020-01-26 DIAGNOSIS — F3132 Bipolar disorder, current episode depressed, moderate: Secondary | ICD-10-CM | POA: Diagnosis not present

## 2020-01-29 DIAGNOSIS — F3132 Bipolar disorder, current episode depressed, moderate: Secondary | ICD-10-CM | POA: Diagnosis not present

## 2020-02-03 DIAGNOSIS — M659 Synovitis and tenosynovitis, unspecified: Secondary | ICD-10-CM | POA: Diagnosis not present

## 2020-02-03 DIAGNOSIS — M25562 Pain in left knee: Secondary | ICD-10-CM | POA: Diagnosis not present

## 2020-02-03 DIAGNOSIS — G8929 Other chronic pain: Secondary | ICD-10-CM | POA: Diagnosis not present

## 2020-02-03 DIAGNOSIS — M1712 Unilateral primary osteoarthritis, left knee: Secondary | ICD-10-CM | POA: Diagnosis not present

## 2020-02-04 DIAGNOSIS — R9431 Abnormal electrocardiogram [ECG] [EKG]: Secondary | ICD-10-CM | POA: Diagnosis not present

## 2020-02-05 DIAGNOSIS — S81811A Laceration without foreign body, right lower leg, initial encounter: Secondary | ICD-10-CM | POA: Diagnosis not present

## 2020-02-20 DIAGNOSIS — F332 Major depressive disorder, recurrent severe without psychotic features: Secondary | ICD-10-CM | POA: Diagnosis not present

## 2020-03-24 DIAGNOSIS — T148XXA Other injury of unspecified body region, initial encounter: Secondary | ICD-10-CM | POA: Diagnosis not present

## 2020-03-24 DIAGNOSIS — R296 Repeated falls: Secondary | ICD-10-CM | POA: Diagnosis not present

## 2020-03-24 DIAGNOSIS — R2681 Unsteadiness on feet: Secondary | ICD-10-CM | POA: Diagnosis not present

## 2020-03-30 DIAGNOSIS — R2681 Unsteadiness on feet: Secondary | ICD-10-CM | POA: Diagnosis not present

## 2020-03-30 DIAGNOSIS — R296 Repeated falls: Secondary | ICD-10-CM | POA: Diagnosis not present

## 2020-03-31 DIAGNOSIS — S71101A Unspecified open wound, right thigh, initial encounter: Secondary | ICD-10-CM | POA: Diagnosis not present

## 2020-04-05 DIAGNOSIS — F3132 Bipolar disorder, current episode depressed, moderate: Secondary | ICD-10-CM | POA: Diagnosis not present

## 2020-04-05 DIAGNOSIS — F419 Anxiety disorder, unspecified: Secondary | ICD-10-CM | POA: Diagnosis not present

## 2020-04-05 DIAGNOSIS — R4189 Other symptoms and signs involving cognitive functions and awareness: Secondary | ICD-10-CM | POA: Diagnosis not present

## 2020-04-05 DIAGNOSIS — Z79899 Other long term (current) drug therapy: Secondary | ICD-10-CM | POA: Diagnosis not present

## 2020-04-16 DIAGNOSIS — S71101A Unspecified open wound, right thigh, initial encounter: Secondary | ICD-10-CM | POA: Diagnosis not present

## 2020-04-22 DIAGNOSIS — F320 Major depressive disorder, single episode, mild: Secondary | ICD-10-CM | POA: Diagnosis not present

## 2020-04-27 DIAGNOSIS — Z23 Encounter for immunization: Secondary | ICD-10-CM | POA: Diagnosis not present

## 2020-04-30 DIAGNOSIS — S71101A Unspecified open wound, right thigh, initial encounter: Secondary | ICD-10-CM | POA: Diagnosis not present

## 2020-05-03 DIAGNOSIS — Z6822 Body mass index (BMI) 22.0-22.9, adult: Secondary | ICD-10-CM | POA: Diagnosis not present

## 2020-05-03 DIAGNOSIS — R296 Repeated falls: Secondary | ICD-10-CM | POA: Diagnosis not present

## 2020-05-03 DIAGNOSIS — R0781 Pleurodynia: Secondary | ICD-10-CM | POA: Diagnosis not present

## 2020-05-03 DIAGNOSIS — W19XXXA Unspecified fall, initial encounter: Secondary | ICD-10-CM | POA: Diagnosis not present

## 2020-05-03 DIAGNOSIS — Y92009 Unspecified place in unspecified non-institutional (private) residence as the place of occurrence of the external cause: Secondary | ICD-10-CM | POA: Diagnosis not present

## 2020-05-03 DIAGNOSIS — S0993XA Unspecified injury of face, initial encounter: Secondary | ICD-10-CM | POA: Diagnosis not present

## 2020-05-10 DIAGNOSIS — M62552 Muscle wasting and atrophy, not elsewhere classified, left thigh: Secondary | ICD-10-CM | POA: Diagnosis not present

## 2020-05-10 DIAGNOSIS — R2689 Other abnormalities of gait and mobility: Secondary | ICD-10-CM | POA: Diagnosis not present

## 2020-05-10 DIAGNOSIS — R296 Repeated falls: Secondary | ICD-10-CM | POA: Diagnosis not present

## 2020-05-10 DIAGNOSIS — Z9181 History of falling: Secondary | ICD-10-CM | POA: Diagnosis not present

## 2020-05-10 DIAGNOSIS — M62551 Muscle wasting and atrophy, not elsewhere classified, right thigh: Secondary | ICD-10-CM | POA: Diagnosis not present

## 2020-05-14 ENCOUNTER — Emergency Department (HOSPITAL_COMMUNITY): Payer: Medicare PPO

## 2020-05-14 ENCOUNTER — Emergency Department (HOSPITAL_COMMUNITY)
Admission: EM | Admit: 2020-05-14 | Discharge: 2020-06-09 | Disposition: E | Payer: Medicare PPO | Attending: Emergency Medicine | Admitting: Emergency Medicine

## 2020-05-14 ENCOUNTER — Other Ambulatory Visit: Payer: Self-pay

## 2020-05-14 DIAGNOSIS — I469 Cardiac arrest, cause unspecified: Secondary | ICD-10-CM | POA: Diagnosis not present

## 2020-05-14 DIAGNOSIS — J3489 Other specified disorders of nose and nasal sinuses: Secondary | ICD-10-CM | POA: Diagnosis not present

## 2020-05-14 DIAGNOSIS — R4182 Altered mental status, unspecified: Secondary | ICD-10-CM | POA: Insufficient documentation

## 2020-05-14 DIAGNOSIS — Z87891 Personal history of nicotine dependence: Secondary | ICD-10-CM | POA: Diagnosis not present

## 2020-05-14 DIAGNOSIS — I499 Cardiac arrhythmia, unspecified: Secondary | ICD-10-CM | POA: Diagnosis not present

## 2020-05-14 DIAGNOSIS — R55 Syncope and collapse: Secondary | ICD-10-CM | POA: Diagnosis not present

## 2020-05-14 DIAGNOSIS — I491 Atrial premature depolarization: Secondary | ICD-10-CM | POA: Diagnosis not present

## 2020-05-14 DIAGNOSIS — Z043 Encounter for examination and observation following other accident: Secondary | ICD-10-CM | POA: Diagnosis not present

## 2020-05-14 DIAGNOSIS — R404 Transient alteration of awareness: Secondary | ICD-10-CM | POA: Diagnosis not present

## 2020-05-14 DIAGNOSIS — Z79899 Other long term (current) drug therapy: Secondary | ICD-10-CM | POA: Insufficient documentation

## 2020-05-14 DIAGNOSIS — I4891 Unspecified atrial fibrillation: Secondary | ICD-10-CM | POA: Diagnosis not present

## 2020-05-14 DIAGNOSIS — Z4682 Encounter for fitting and adjustment of non-vascular catheter: Secondary | ICD-10-CM | POA: Diagnosis not present

## 2020-05-14 DIAGNOSIS — I4510 Unspecified right bundle-branch block: Secondary | ICD-10-CM | POA: Diagnosis not present

## 2020-05-14 DIAGNOSIS — Z20822 Contact with and (suspected) exposure to covid-19: Secondary | ICD-10-CM | POA: Diagnosis not present

## 2020-05-14 DIAGNOSIS — J4530 Mild persistent asthma, uncomplicated: Secondary | ICD-10-CM | POA: Diagnosis not present

## 2020-05-14 DIAGNOSIS — S71101A Unspecified open wound, right thigh, initial encounter: Secondary | ICD-10-CM | POA: Diagnosis not present

## 2020-05-14 DIAGNOSIS — R296 Repeated falls: Secondary | ICD-10-CM | POA: Diagnosis not present

## 2020-05-14 DIAGNOSIS — G9389 Other specified disorders of brain: Secondary | ICD-10-CM | POA: Diagnosis not present

## 2020-05-14 LAB — URINALYSIS, ROUTINE W REFLEX MICROSCOPIC
Bilirubin Urine: NEGATIVE
Glucose, UA: NEGATIVE mg/dL
Hgb urine dipstick: NEGATIVE
Ketones, ur: NEGATIVE mg/dL
Leukocytes,Ua: NEGATIVE
Nitrite: NEGATIVE
Protein, ur: 100 mg/dL — AB
Specific Gravity, Urine: 1.015 (ref 1.005–1.030)
pH: 5 (ref 5.0–8.0)

## 2020-05-14 LAB — COMPREHENSIVE METABOLIC PANEL
ALT: 471 U/L — ABNORMAL HIGH (ref 0–44)
AST: 495 U/L — ABNORMAL HIGH (ref 15–41)
Albumin: 2.2 g/dL — ABNORMAL LOW (ref 3.5–5.0)
Alkaline Phosphatase: 84 U/L (ref 38–126)
Anion gap: 20 — ABNORMAL HIGH (ref 5–15)
BUN: 7 mg/dL — ABNORMAL LOW (ref 8–23)
CO2: 14 mmol/L — ABNORMAL LOW (ref 22–32)
Calcium: 7.9 mg/dL — ABNORMAL LOW (ref 8.9–10.3)
Chloride: 96 mmol/L — ABNORMAL LOW (ref 98–111)
Creatinine, Ser: 1.29 mg/dL — ABNORMAL HIGH (ref 0.44–1.00)
GFR, Estimated: 43 mL/min — ABNORMAL LOW (ref >60)
Glucose, Bld: 153 mg/dL — ABNORMAL HIGH (ref 70–99)
Potassium: 4.4 mmol/L (ref 3.5–5.1)
Sodium: 130 mmol/L — ABNORMAL LOW (ref 135–145)
Total Bilirubin: 0.8 mg/dL (ref 0.3–1.2)
Total Protein: 4.4 g/dL — ABNORMAL LOW (ref 6.5–8.1)

## 2020-05-14 LAB — I-STAT ARTERIAL BLOOD GAS, ED
Acid-base deficit: 13 mmol/L — ABNORMAL HIGH (ref 0.0–2.0)
Bicarbonate: 16.6 mmol/L — ABNORMAL LOW (ref 20.0–28.0)
Calcium, Ion: 1.06 mmol/L — ABNORMAL LOW (ref 1.15–1.40)
HCT: 26 % — ABNORMAL LOW (ref 36.0–46.0)
Hemoglobin: 8.8 g/dL — ABNORMAL LOW (ref 12.0–15.0)
O2 Saturation: 100 %
Patient temperature: 94.5
Potassium: 4 mmol/L (ref 3.5–5.1)
Sodium: 131 mmol/L — ABNORMAL LOW (ref 135–145)
TCO2: 18 mmol/L — ABNORMAL LOW (ref 22–32)
pCO2 arterial: 53 mmHg — ABNORMAL HIGH (ref 32.0–48.0)
pH, Arterial: 7.089 — CL (ref 7.350–7.450)
pO2, Arterial: 401 mmHg — ABNORMAL HIGH (ref 83.0–108.0)

## 2020-05-14 LAB — CBC WITH DIFFERENTIAL/PLATELET
Abs Immature Granulocytes: 1.12 10*3/uL — ABNORMAL HIGH (ref 0.00–0.07)
Basophils Absolute: 0.1 10*3/uL (ref 0.0–0.1)
Basophils Relative: 0 %
Eosinophils Absolute: 0.2 10*3/uL (ref 0.0–0.5)
Eosinophils Relative: 1 %
HCT: 33.6 % — ABNORMAL LOW (ref 36.0–46.0)
Hemoglobin: 10.4 g/dL — ABNORMAL LOW (ref 12.0–15.0)
Immature Granulocytes: 5 %
Lymphocytes Relative: 41 %
Lymphs Abs: 9.1 10*3/uL — ABNORMAL HIGH (ref 0.7–4.0)
MCH: 32.2 pg (ref 26.0–34.0)
MCHC: 31 g/dL (ref 30.0–36.0)
MCV: 104 fL — ABNORMAL HIGH (ref 80.0–100.0)
Monocytes Absolute: 2.2 10*3/uL — ABNORMAL HIGH (ref 0.1–1.0)
Monocytes Relative: 10 %
Neutro Abs: 9.2 10*3/uL — ABNORMAL HIGH (ref 1.7–7.7)
Neutrophils Relative %: 43 %
Platelets: 209 10*3/uL (ref 150–400)
RBC: 3.23 MIL/uL — ABNORMAL LOW (ref 3.87–5.11)
RDW: 13.8 % (ref 11.5–15.5)
WBC: 22 10*3/uL — ABNORMAL HIGH (ref 4.0–10.5)
nRBC: 0.4 % — ABNORMAL HIGH (ref 0.0–0.2)

## 2020-05-14 LAB — I-STAT CHEM 8, ED
BUN: 7 mg/dL — ABNORMAL LOW (ref 8–23)
Calcium, Ion: 1.04 mmol/L — ABNORMAL LOW (ref 1.15–1.40)
Chloride: 95 mmol/L — ABNORMAL LOW (ref 98–111)
Creatinine, Ser: 1.1 mg/dL — ABNORMAL HIGH (ref 0.44–1.00)
Glucose, Bld: 144 mg/dL — ABNORMAL HIGH (ref 70–99)
HCT: 31 % — ABNORMAL LOW (ref 36.0–46.0)
Hemoglobin: 10.5 g/dL — ABNORMAL LOW (ref 12.0–15.0)
Potassium: 4.3 mmol/L (ref 3.5–5.1)
Sodium: 130 mmol/L — ABNORMAL LOW (ref 135–145)
TCO2: 18 mmol/L — ABNORMAL LOW (ref 22–32)

## 2020-05-14 LAB — RESPIRATORY PANEL BY RT PCR (FLU A&B, COVID)
Influenza A by PCR: NEGATIVE
Influenza B by PCR: NEGATIVE
SARS Coronavirus 2 by RT PCR: NEGATIVE

## 2020-05-14 LAB — ETHANOL: Alcohol, Ethyl (B): <10 mg/dL (ref <10)

## 2020-05-14 LAB — PROTIME-INR
INR: 1.2 (ref 0.8–1.2)
Prothrombin Time: 14.8 seconds (ref 11.4–15.2)

## 2020-05-14 LAB — LACTIC ACID, PLASMA: Lactic Acid, Venous: >11 mmol/L (ref 0.5–1.9)

## 2020-05-14 LAB — TROPONIN I (HIGH SENSITIVITY): Troponin I (High Sensitivity): 198 ng/L (ref <18)

## 2020-05-14 MED ORDER — MORPHINE 100MG IN NS 100ML (1MG/ML) PREMIX INFUSION
0.0000 mg/h | INTRAVENOUS | Status: DC
  Administered 2020-05-14: 5 mg/h via INTRAVENOUS
  Filled 2020-05-14 (×2): qty 100

## 2020-05-14 MED ORDER — DEXTROSE 5 % IV SOLN: INTRAVENOUS | Status: DC

## 2020-05-14 MED ORDER — DIPHENHYDRAMINE HCL 50 MG/ML IJ SOLN: 25.0000 mg | INTRAMUSCULAR | Status: DC | PRN

## 2020-05-14 MED ORDER — ACETAMINOPHEN 325 MG PO TABS: 650.0000 mg | ORAL_TABLET | Freq: Four times a day (QID) | ORAL | Status: DC | PRN

## 2020-05-14 MED ORDER — LORAZEPAM 2 MG/ML IJ SOLN: 2.0000 mg | INTRAMUSCULAR | Status: DC | PRN

## 2020-05-14 MED ORDER — POLYVINYL ALCOHOL 1.4 % OP SOLN: 1.0000 [drp] | Freq: Four times a day (QID) | OPHTHALMIC | Status: DC | PRN

## 2020-05-14 MED ORDER — NOREPINEPHRINE 4 MG/250ML-% IV SOLN: 0.0000 ug/min | INTRAVENOUS | Status: DC

## 2020-05-14 MED ORDER — SODIUM CHLORIDE 0.9 % IV BOLUS
1000.0000 mL | Freq: Once | INTRAVENOUS | Status: AC
  Administered 2020-05-14: 1000 mL via INTRAVENOUS

## 2020-05-14 MED ORDER — GLYCOPYRROLATE 0.2 MG/ML IJ SOLN: 0.2000 mg | INTRAMUSCULAR | Status: DC | PRN

## 2020-05-14 MED ORDER — MORPHINE SULFATE (PF) 2 MG/ML IV SOLN: 2.0000 mg | INTRAVENOUS | Status: DC | PRN

## 2020-05-14 MED ORDER — ACETAMINOPHEN 650 MG RE SUPP: 650.0000 mg | Freq: Four times a day (QID) | RECTAL | Status: DC | PRN

## 2020-05-14 MED ORDER — GLYCOPYRROLATE 1 MG PO TABS: 1.0000 mg | ORAL_TABLET | ORAL | Status: DC | PRN

## 2020-05-14 MED ORDER — MORPHINE BOLUS VIA INFUSION
5.0000 mg | INTRAVENOUS | Status: DC | PRN
  Filled 2020-05-14: qty 5

## 2020-05-14 MED ORDER — NOREPINEPHRINE 4 MG/250ML-% IV SOLN
INTRAVENOUS | Status: AC
  Administered 2020-05-14: 10 ug/min via INTRAVENOUS
  Filled 2020-05-14: qty 250

## 2020-05-14 MED ORDER — MORPHINE SULFATE (PF) 4 MG/ML IV SOLN
4.0000 mg | Freq: Once | INTRAVENOUS | Status: AC
  Administered 2020-05-14: 4 mg via INTRAVENOUS
  Filled 2020-05-14: qty 1

## 2020-05-14 NOTE — Progress Notes (Signed)
Met with husband at bedside, Doris Orr has been having a rough time lately with recurrent falls, confusion, worsening bipolar symptoms all without a clear unifying diagnosis.  He agrees that in light of current situation we should allow Doris Orr to pass in peace.  Comfort care orders entered.  Erskine Emery MD PCCM

## 2020-05-14 NOTE — ED Provider Notes (Signed)
[facility] Note   CSN: [id number] Arrival date & time: 06/02/2020  2048     History No chief complaint on file.   Doris Orr is a 79 y.o. female.  The history is provided by the EMS personnel. The history is limited by the condition of the patient.  Cardiac Arrest Witnessed by:  Not witnessed Incident location:  Home Condition upon EMS arrival:  Apneic and unresponsive Pulse:  Absent Initial cardiac rhythm per EMS:  PEA Treatments prior to arrival:  Intubation, ACLS protocol and vascular access Medications given prior to ED:  Epinephrine IV access type:  Peripheral and intraosseous Airway:  Intubation prior to arrival Rhythm on admission to ED:  Normal sinus (RBBB)      Past Medical History:  Diagnosis Date   RA (rheumatoid arthritis) (HCC)    Dr Kellie Simmeringruslow    Patient Active Problem List   Diagnosis Date Noted   Mild persistent chronic asthma without complication 12/06/2014   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     Family History  Problem Relation Age of Onset   Skin cancer Mother    Colon cancer Mother    Kidney cancer Father    Prostate cancer Father    Rheum arthritis Maternal Aunt     Social History   Tobacco Use   Smoking status: Former Smoker    Packs/day: 0.25    Years: 1.00    Pack years: 0.25    Types: Cigarettes    Quit date: 07/11/1971    Years since quitting: 48.8   Smokeless tobacco: Never Used  Substance Use Topics   Alcohol use: No    Alcohol/week: 0.0 standard drinks   Drug use: No    Home Medications Prior to Admission medications   Medication Sig Start Date End Date Taking? Authorizing Provider  acetaminophen (TYLENOL) 325 MG tablet Take 650 mg by mouth every 6 (six) hours as  needed for mild pain or headache.    Yes [provider]  alendronate (FOSAMAX) 70 MG tablet Take 70 mg by mouth every Sunday. 04/07/20  Yes [provider]  ALPRAZolam Prudy Feeler(XANAX) 1 MG tablet Take 1 mg by mouth daily as needed for anxiety.    Yes [provider]  ascorbic acid (VITAMIN C) 500 MG tablet Take 500-1,000 mg by mouth daily.    Yes [provider]  Calcium Carb-Cholecalciferol (CALCIUM + D3 PO) Take 1 tablet by mouth daily.   Yes [provider]  Cholecalciferol (VITAMIN D-3 PO) Take 1 capsule by mouth 2 (two) times a week.    Yes [provider]  Coenzyme Q10 100 MG capsule Take 100 mg by mouth daily.    Yes [provider]  donepezil (ARICEPT) 5 MG tablet Take 5 mg by mouth at bedtime.   Yes [provider]  FLUoxetine (PROZAC) 40 MG capsule Take 40 mg by mouth in the morning.   Yes [provider]  hydroxychloroquine (PLAQUENIL) 200 MG tablet Take 400 mg by mouth daily.   Yes [provider]  lamoTRIgine (LAMICTAL) 100 MG tablet Take 100-150 mg by mouth See admin instructions. Take 150 mg by mouth in the morning and 100 mg with lunch 04/28/20  Yes [provider]  methotrexate (RHEUMATREX)  2.5 MG tablet Take 10 mg by mouth See admin instructions. Take 10 mg by mouth on Wednesdays and Thursdays- Caution:Chemotherapy. Protect from light.   Yes [provider]  Multiple Vitamins-Minerals (ONE-A-DAY WOMENS 50 PLUS) TABS Take 1 tablet by mouth daily with breakfast.   Yes [provider]  naproxen sodium (ALEVE) 220 MG tablet Take 220-440 mg by mouth 2 (two) times daily as needed (for mild pain or headaches).   Yes [provider]  risperiDONE (RISPERDAL) 0.5 MG tablet Take 0.5 mg by mouth 2 (two) times daily.   Yes [provider]    Allergies    Sulfa antibiotics and Sulfamethoxazole  Review of Systems   Review of Systems  Unable to perform ROS: Patient  unresponsive    Physical Exam Updated Vital Signs BP (!) 146/79    Pulse 78    Temp (!) 91.1 F (32.8 C)    Resp (!) 30    Ht 5\' 7"  (1.702 m)    Wt 75.8 kg    SpO2 99%    BMI 26.17 kg/m   Physical Exam Vitals and nursing note reviewed.  Constitutional:      General: She is in acute distress.     Appearance: She is well-developed. She is ill-appearing.  HENT:     Head: Normocephalic and atraumatic.     Right Ear: External ear normal.     Left Ear: External ear normal.     Nose: Nose normal.     Mouth/Throat:     Mouth: Mucous membranes are dry.     Pharynx: Oropharynx is clear.  Eyes:     Conjunctiva/sclera: Conjunctivae normal.     Pupils:     Right eye: Pupil is not round and not reactive.     Left eye: Pupil is not round and not reactive.  Cardiovascular:     Rate and Rhythm: Normal rate and regular rhythm.     Pulses:          Carotid pulses are 1+ on the right side and 1+ on the left side.      Radial pulses are 1+ on the right side and 1+ on the left side.       Femoral pulses are 1+ on the right side and 1+ on the left side.    Heart sounds: No murmur heard.  No gallop.   Pulmonary:     Effort: No respiratory distress.     Breath sounds: Rhonchi present.  Abdominal:     Palpations: Abdomen is soft.     Tenderness: There is no abdominal tenderness.  Musculoskeletal:     Cervical back: Neck supple.     Right lower leg: 1+ Pitting Edema present.     Left lower leg: 1+ Pitting Edema present.  Skin:    General: Skin is warm and dry.  Neurological:     Mental Status: She is unresponsive.     GCS: GCS eye subscore is 1. GCS verbal subscore is 1. GCS motor subscore is 1.     ED Results / Procedures / Treatments   Labs (all labs ordered are listed, but only abnormal results are displayed) Labs Reviewed  COMPREHENSIVE METABOLIC PANEL - Abnormal; Notable for the following components:      Result Value   Sodium 130 (*)    Chloride 96 (*)    CO2 14 (*)     Glucose, Bld 153 (*)    BUN 7 (*)    Creatinine,  Ser 1.29 (*)    Calcium 7.9 (*)    Total Protein 4.4 (*)    Albumin 2.2 (*)    AST 495 (*)    ALT 471 (*)    GFR, Estimated 43 (*)    Anion gap 20 (*)    All other components within normal limits  LACTIC ACID, PLASMA - Abnormal; Notable for the following components:   Lactic Acid, Venous >11.0 (*)    All other components within normal limits  CBC WITH DIFFERENTIAL/PLATELET - Abnormal; Notable for the following components:   WBC 22.0 (*)    RBC 3.23 (*)    Hemoglobin 10.4 (*)    HCT 33.6 (*)    MCV 104.0 (*)    nRBC 0.4 (*)    Neutro Abs 9.2 (*)    Lymphs Abs 9.1 (*)    Monocytes Absolute 2.2 (*)    Abs Immature Granulocytes 1.12 (*)    All other components within normal limits  URINALYSIS, ROUTINE W REFLEX MICROSCOPIC - Abnormal; Notable for the following components:   APPearance HAZY (*)    Protein, ur 100 (*)    Bacteria, UA RARE (*)    All other components within normal limits  I-STAT ARTERIAL BLOOD GAS, ED - Abnormal; Notable for the following components:   pH, Arterial 7.089 (*)    pCO2 arterial 53.0 (*)    pO2, Arterial 401 (*)    Bicarbonate 16.6 (*)    TCO2 18 (*)    Acid-base deficit 13.0 (*)    Sodium 131 (*)    Calcium, Ion 1.06 (*)    HCT 26.0 (*)    Hemoglobin 8.8 (*)    All other components within normal limits  I-STAT CHEM 8, ED - Abnormal; Notable for the following components:   Sodium 130 (*)    Chloride 95 (*)    BUN 7 (*)    Creatinine, Ser 1.10 (*)    Glucose, Bld 144 (*)    Calcium, Ion 1.04 (*)    TCO2 18 (*)    Hemoglobin 10.5 (*)    HCT 31.0 (*)    All other components within normal limits  TROPONIN I (HIGH SENSITIVITY) - Abnormal; Notable for the following components:   Troponin I (High Sensitivity) 198 (*)    All other components within normal limits  RESPIRATORY PANEL BY RT PCR (FLU A&B, COVID)  URINE CULTURE  CULTURE, BLOOD (ROUTINE X 2)  CULTURE, BLOOD (ROUTINE X 2)  ETHANOL    PROTIME-INR  LACTIC ACID, PLASMA  PATHOLOGIST SMEAR REVIEW  TYPE AND SCREEN  ABO/RH  TROPONIN I (HIGH SENSITIVITY)    EKG EKG Interpretation  Date/Time:  Friday May 19, 2020 20:50:57 EDT Ventricular Rate:  93 PR Interval:    QRS Duration: 149 QT Interval:  455 QTC Calculation: 566 R Axis:   -111 Text Interpretation: Sinus rhythm Right bundle branch block Baseline wander in lead(s) V4 RBBB new since 2008 Confirmed by Pricilla Loveless [phone]) on 2020-05-19 8:57:32 PM   Radiology CT Head Wo Contrast  Result Date: 2020/05/19 CLINICAL DATA:  Cerebral hemorrhage suspected. Post CPR. Found unresponsive. EXAM: CT HEAD WITHOUT CONTRAST TECHNIQUE: Contiguous axial images were obtained from the base of the skull through the vertex without intravenous contrast. COMPARISON:  Head CT 03/30/2020 FINDINGS: Brain: No evidence of acute hemorrhage. There is slight sulcal effacement and loss of gray-white differentiation involving the right parietal, temporal and occipital lobes. There is no subdural or extra-axial collection. No hydrocephalus. Basilar cisterns  are patent. There is no midline shift. Vascular: Possible hyperdense right MCA. Skull: No fracture or focal lesion. Sinuses/Orbits: Bilateral cataract resection. No acute findings. Slight mucosal thickening of right maxillary sinus. Other: Hyperdense soft tissue structure inferior to the left orbit suspicious for hematoma. IMPRESSION: 1. Sulcal effacement and loss of gray-white differentiation involving the right parietal, temporal, and occipital lobes suspicious for acute right MCA infarct. Possible hyperdense right MCA. Consider CTA. No evidence of acute hemorrhage. 2. Hyperdense soft tissue structure inferior to the left orbit suspicious for hematoma. These results were called by telephone at the time of interpretation on 06/03/2020 at 10:58 pm to Dr Pricilla Loveless , who verbally acknowledged these results. Electronically Signed   By: Narda Rutherford M.D.   On: 05/22/2020 22:58   DG Chest Portable 1 View  Result Date: 05/17/2020 CLINICAL DATA:  Post arrest EXAM: PORTABLE CHEST 1 VIEW COMPARISON:  None. FINDINGS: The heart size and mediastinal contours are within normal limits. ET tube is 1.5 cm above the carina. NG tube is seen within the proximal stomach with the side hole likely just at the GE junction. Both lungs are clear. The visualized skeletal structures are unremarkable. IMPRESSION: No active disease. ETT in satisfactory position. NG tube could be advanced Electronically Signed   By: Jonna Clark M.D.   On: 05/30/2020 21:19    Procedures Procedures (including critical care time)  Medications Ordered in ED Medications  norepinephrine (LEVOPHED) 4mg  in premix infusion (5 mcg/min Intravenous Rate/Dose Change 05/19/2020 2313)  dextrose 5 % solution (has no administration in time range)  acetaminophen (TYLENOL) tablet 650 mg (has no administration in time range)    Or  acetaminophen (TYLENOL) suppository 650 mg (has no administration in time range)  diphenhydrAMINE (BENADRYL) injection 25 mg (has no administration in time range)  glycopyrrolate (ROBINUL) tablet 1 mg (has no administration in time range)    Or  glycopyrrolate (ROBINUL) injection 0.2 mg (has no administration in time range)    Or  glycopyrrolate (ROBINUL) injection 0.2 mg (has no administration in time range)  polyvinyl alcohol (LIQUIFILM TEARS) 1.4 % ophthalmic solution 1 drop (has no administration in time range)  morphine 2 MG/ML injection 2 mg (has no administration in time range)  morphine bolus via infusion 5 mg (has no administration in time range)  morphine 100mg  in NS 13/5/21 (1mg /mL) infusion - premix (has no administration in time range)  morphine 4 MG/ML injection 4 mg (has no administration in time range)  LORazepam (ATIVAN) injection 2-4 mg (has no administration in time range)  sodium chloride 0.9 % bolus 1,000 mL (0 mLs Intravenous Stopped  05/30/2020 2205)    ED Course  I have reviewed the triage vital signs and the nursing notes.  Pertinent labs & imaging results that were available during my care of the patient were reviewed by me and considered in my medical decision making (see chart for details).    MDM Rules/Calculators/A&P                          The patient is a 79yo female, PMH depression, anxiety, bipolar who presents to the ED via EMS for post arrest.  On my initial evaluation, the patient is hypotensive but with a pulse, ET tube in place, ill appearing, GCS 3. Patient placed on Levophed drip and provided IV fluid bolus.  Unclear etiology of patient's cardiac arrest. Patient's husband 13/5/21 came to bedside. He reports that she  has not been eating or drinking well due to her mental illness for several weeks, and she has been spending most of her days in bed despite multiple medications, frequent psychiatric appointments. He reports that he left her to lie down, and about an hour later he checked on her and found her on the floor.  Had a goals of care discussion with the patient's husband, Molly Maduro.  He reports that she has just been dwindling and not been well for a few months.  At this point in time, he wishes to make her a DO NOT RESUSCITATE, specifically he would not want further compressions, he would not want cardioversion or defibrillation, and he does not want invasive procedures such as central lines at this time.  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  Unclear etiology of cardiopulmonary arrest.  Patient completely unresponsive, still GCS 3. EKG nonischemic but with right bundle branch block.  Troponin and lactic acid elevated, leukocytosis on labs, urine and chest x-ray negative for obvious sources of infection.  CT head remarkable for possible CVA.  Consulted ICU for further evaluation and management of cardiopulmonary arrest.  They accepted the patient to their service.  No further acute events while under my care.   Patient in critical condition.  The care of this patient was overseen by Dr. Criss Alvine, who agreed with evaluation and plan of care.   Final Clinical Impression(s) / ED Diagnoses Final diagnoses:  Cardiac arrest [facility])    Rx / DC Orders ED Discharge Orders    None       Loletha Carrow, MD 2020/06/04 5784    Pricilla Loveless, MD 05/13/2020 1512

## 2020-05-14 NOTE — Code Documentation (Signed)
Pt placed on vent, RT at bedside

## 2020-05-14 NOTE — ED Triage Notes (Addendum)
Pt presents to ED as post CPR. Pt found unresponsive by husband seen 1h before. Pt found in PEA, received 60m CPR, epi x4. EMS achieved ROSC, pt in RBBB. EMS given 34mcg/m epi drip, placd king airway and IO in L tib

## 2020-05-14 NOTE — H&P (Signed)
NAME:  Doris Orr, MRN:  [id number], DOB:  July 23, 1940, LOS: 0 ADMISSION DATE:  06/06/2020, CONSULTATION DATE:  05/20/2020 REFERRING MD:  Criss Alvine, CHIEF COMPLAINT:  Cardiac arrest   Brief History   79 year old woman w hx of RA, chronic anxiety, memory issues presenting after prolonged OOH cardiac arrest.  History of present illness   79 year old woman w hx of RA, chronic anxiety, memory issues presenting after prolonged OOH cardiac arrest.  Laying in bed for up to an hour before EMS called.  PEA arrest >20 minutes CPR.  4x epi, intubated before ROSC.  CT showing probably massive R MCA infarct.  PCCM consulted to talk with family and consider ICU admission  Past Medical History  RA Chronic anxiety Memory issues  Significant Hospital Events   06/03/2020  Consults:  N/A  Procedures:  N/A  Significant Diagnostic Tests:  CT Head: R hemisphere sulcal effacement and possible R MCA hyperdense sign  Micro Data:  COVID neg  Antimicrobials:  none   Interim history/subjective:  consulted  Objective   Blood pressure (!) 163/81, pulse 80, temperature (!) 91.2 F (32.9 C), resp. rate 20, height 5\' 7"  (1.702 m), weight 75.8 kg, SpO2 100 %.    Vent Mode: PRVC FiO2 (%):  [60 %-100 %] 60 % Set Rate:  [15 bmp-20 bmp] 20 bmp Vt Set:  [490 mL] 490 mL PEEP:  [5 cmH20] 5 cmH20   Intake/Output Summary (Last 24 hours) at 05/18/2020 2304 Last data filed at 05/27/2020 2205 Gross per 24 hour  Intake 1000 ml  Output --  Net 1000 ml   Filed Weights   05/23/2020 2058  Weight: 75.8 kg    Examination: Unresponsive, GCS 3 Pupils small, fixed, no corneals, no gag Triggers vent at about 4x/min L periorbital bruising Ext with trace edema Abdomen soft, hypoactive BS Lungs clear  Resolved Hospital Problem list   N/A  Assessment & Plan:  OOH prolonged cardiac arrest, signs of massive stroke on CT Head, poor neurological exam - Will discuss GoC with husband, should probably allow Ms.  Cowens to pass in peace, further plans pending this discussion - Continue vent support in interim  Diagnoses that are immediately life threatening include cardiac arrest, stroke Critical test findings: CT head large R MCA stroke Interventions today to address these diagnoses are vent support, family discussions Likelihood of life-threatening deterioration without intervention is high.  Labs   CBC: Recent Labs  Lab 05/26/2020 2056 05/22/2020 2100 06/02/2020 2125  WBC 22.0*  --   --   NEUTROABS 9.2*  --   --   HGB 10.4* 10.5* 8.8*  HCT 33.6* 31.0* 26.0*  MCV 104.0*  --   --   PLT 209  --   --     Basic Metabolic Panel: Recent Labs  Lab 05/10/2020 2056 05/27/2020 2100 05/24/2020 2125  NA 130* 130* 131*  K 4.4 4.3 4.0  CL 96* 95*  --   CO2 14*  --   --   GLUCOSE 153* 144*  --   BUN 7* 7*  --   CREATININE 1.29* 1.10*  --   CALCIUM 7.9*  --   --    GFR: Estimated Creatinine Clearance: 45.5 mL/min (A) (by C-G formula based on SCr of 1.1 mg/dL (H)). Recent Labs  Lab 05/26/2020 2056  WBC 22.0*  LATICACIDVEN >11.0*    Liver Function Tests: Recent Labs  Lab 05/10/2020 2056  AST 495*  ALT 471*  ALKPHOS 84  BILITOT 0.8  PROT 4.4*  ALBUMIN 2.2*   No results for input(s): LIPASE, AMYLASE in the last 168 hours. No results for input(s): AMMONIA in the last 168 hours.  ABG    Component Value Date/Time   PHART 7.089 (LL) 05/29/2020 2125   PCO2ART 53.0 (H) 05/21/2020 2125   PO2ART 401 (H) 05/31/2020 2125   HCO3 16.6 (L) 05/10/2020 2125   TCO2 18 (L) 05/21/2020 2125   ACIDBASEDEF 13.0 (H) 05/30/2020 2125   O2SAT 100.0 05/20/2020 2125     Coagulation Profile: Recent Labs  Lab 05/14/20 2056  INR 1.2    Cardiac Enzymes: No results for input(s): CKTOTAL, CKMB, CKMBINDEX, TROPONINI in the last 168 hours.  HbA1C: No results found for: HGBA1C  CBG: No results for input(s): GLUCAP in the last 168 hours.  Review of Systems:   Comatose, cannot assess  Past Medical History   She,  has a past medical history of RA (rheumatoid arthritis) (HCC).   Surgical History    Past Surgical History:  Procedure Laterality Date  . NASAL SEPTUM SURGERY  1974     Social History   reports that she quit smoking about 48 years ago. Her smoking use included cigarettes. She has a 0.25 pack-year smoking history. She has never used smokeless tobacco. She reports that she does not drink alcohol and does not use drugs.   Family History   Her family history includes Colon cancer in her mother; Kidney cancer in her father; Prostate cancer in her father; Rheum arthritis in her maternal aunt; Skin cancer in her mother.   Allergies Allergies  Allergen Reactions  . Sulfa Antibiotics Swelling, Rash and Other (See Comments)    Rash appeared and tongue/face/neck became swollen   . Sulfamethoxazole Swelling, Rash and Other (See Comments)    Rash appeared and tongue/face/neck became swollen     Home Medications  Prior to Admission medications   Medication Sig Start Date End Date Taking? Authorizing Provider  acetaminophen (TYLENOL) 325 MG tablet Take 650 mg by mouth every 6 (six) hours as needed for mild pain or headache.    Yes [provider]  alendronate (FOSAMAX) 70 MG tablet Take 70 mg by mouth every Sunday. 04/07/20  Yes [provider]  ALPRAZolam Prudy Feeler) 1 MG tablet Take 1 mg by mouth daily as needed for anxiety.    Yes [provider]  ascorbic acid (VITAMIN C) 500 MG tablet Take 500-1,000 mg by mouth daily.    Yes [provider]  Calcium Carb-Cholecalciferol (CALCIUM + D3 PO) Take 1 tablet by mouth daily.   Yes [provider]  Cholecalciferol (VITAMIN D-3 PO) Take 1 capsule by mouth 2 (two) times a week.    Yes [provider]  Coenzyme Q10 100 MG capsule Take 100 mg by mouth daily.    Yes [provider]  donepezil (ARICEPT) 5 MG tablet Take 5 mg by mouth at bedtime.   Yes [provider]  FLUoxetine  (PROZAC) 40 MG capsule Take 40 mg by mouth in the morning.   Yes [provider]  hydroxychloroquine (PLAQUENIL) 200 MG tablet Take 400 mg by mouth daily.   Yes [provider]  lamoTRIgine (LAMICTAL) 100 MG tablet Take 100-150 mg by mouth See admin instructions. Take 150 mg by mouth in the morning and 100 mg with lunch 04/28/20  Yes [provider]  methotrexate (RHEUMATREX) 2.5 MG tablet Take 10 mg by mouth See admin instructions. Take 10 mg by mouth  on Wednesdays and Thursdays- Caution:Chemotherapy. Protect from light.   Yes [provider]  Multiple Vitamins-Minerals (ONE-A-DAY WOMENS 50 PLUS) TABS Take 1 tablet by mouth daily with breakfast.   Yes [provider]  naproxen sodium (ALEVE) 220 MG tablet Take 220-440 mg by mouth 2 (two) times daily as needed (for mild pain or headaches).   Yes [provider]  risperiDONE (RISPERDAL) 0.5 MG tablet Take 0.5 mg by mouth 2 (two) times daily.   Yes [provider]     Critical care time: 32 minutes

## 2020-05-15 LAB — BLOOD CULTURE ID PANEL (REFLEXED) - BCID2

## 2020-05-15 LAB — TYPE AND SCREEN
ABO/RH(D): A POS
Antibody Screen: NEGATIVE

## 2020-05-16 LAB — CULTURE, BLOOD (ROUTINE X 2)

## 2020-05-16 LAB — URINE CULTURE: Culture: NO GROWTH

## 2020-05-18 LAB — PATHOLOGIST SMEAR REVIEW

## 2020-05-19 LAB — CULTURE, BLOOD (ROUTINE X 2)
Culture: NO GROWTH
Special Requests: ADEQUATE

## 2020-06-09 NOTE — Consult Note (Signed)
Saw pt's husband, Molly Maduro, bedside, and visited with him. Offered spiritual support and prayer, which he appreciated. Family is Christian ([employer]). He said they had been married 41 yrs. Pls call again if further need of chaplain services.  Rev. Donnel Saxon Chaplain

## 2020-06-09 NOTE — Procedures (Signed)
Extubation Procedure Note  Patient Details:   Name: Doris Orr DOB: 11-13-1940 MRN: [id number]   Airway Documentation:  Airway  (Active)     Airway 7 mm (Active)  Secured at (cm) 27 cm 05/10/2020 2117  Measured From Lips 05/12/2020 2117  Secured Location Right 05/21/2020 2048  Secured By [employer] 05/29/2020 2048  Tube Holder Repositioned Yes 05/19/2020 2048  Cuff Pressure (cm H2O) 30 cm H2O 05/26/2020 2048   Vent end date: May 26, 2020 Vent end time: 0010   Evaluation  O2 sats: currently acceptable Complications: No apparent complications Patient did  tolerate procedure well.     No  Pt was extubated to comfort care.   Max Sane 2020/05/26, 12:22 AM

## 2020-06-09 NOTE — ED Notes (Addendum)
Time of death at 247 pronounced by two RN's

## 2020-06-09 DEATH — deceased

## 2021-11-04 IMAGING — CT CT HEAD W/O CM
4 series · 16 of 47 positions shown, 18 images · non-contrast
Comparison: Head CT 03/30/2020

CLINICAL DATA: Cerebral hemorrhage suspected. Post CPR. Found
unresponsive.

EXAM:
CT HEAD WITHOUT CONTRAST
TECHNIQUE: Contiguous axial images were obtained from the base of the skull
through the vertex without intravenous contrast.

[Series 3: head wo · axial · 0.44mm/px · z∈[+1218,+1348]mm · 7 of 36 slices shown, 9 images]
[im 5/36  brain]
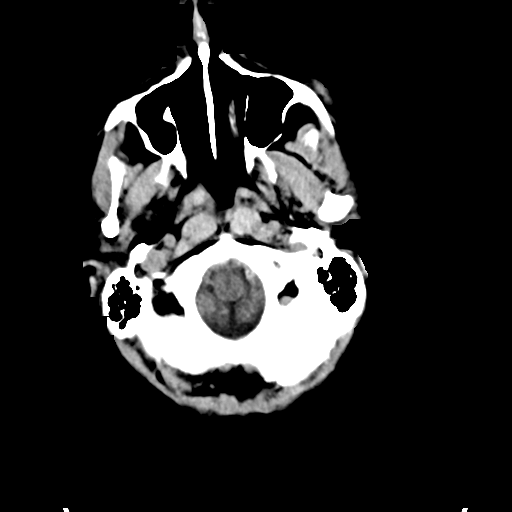
[im 5/36  bone]
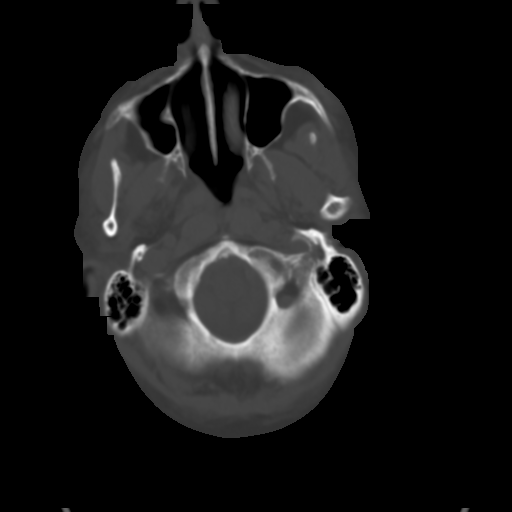
[im 9/36  brain]
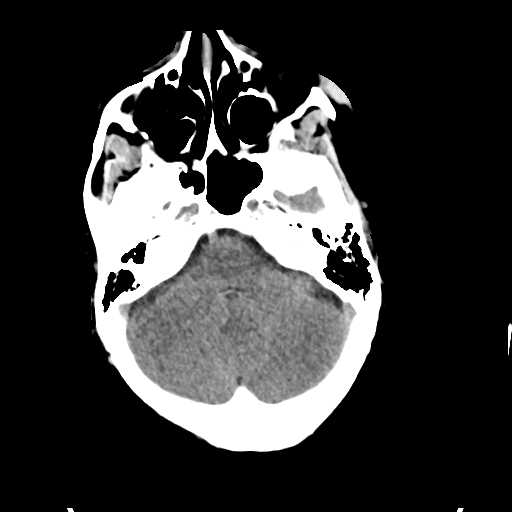
[im 14/36  brain]
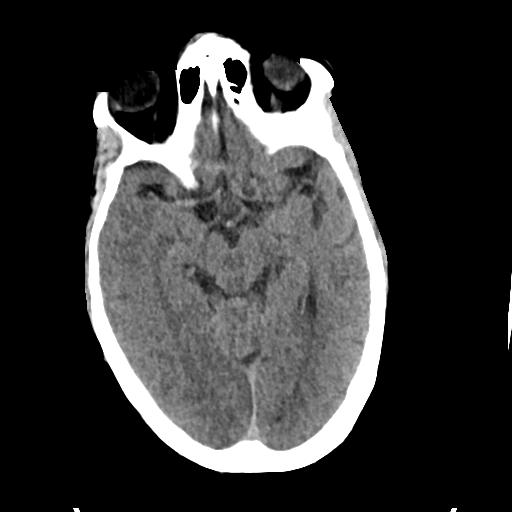
[im 18/36  brain]
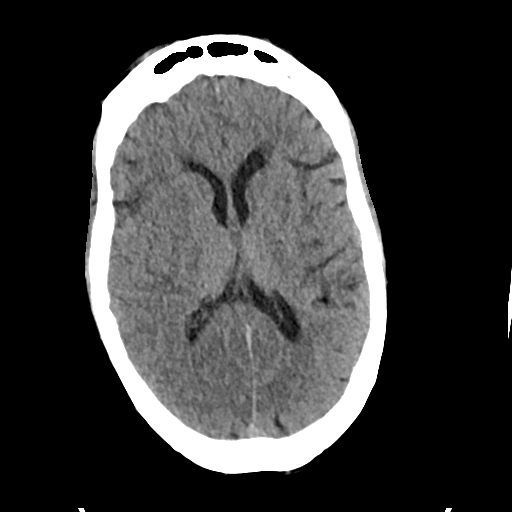
[im 22/36  brain]
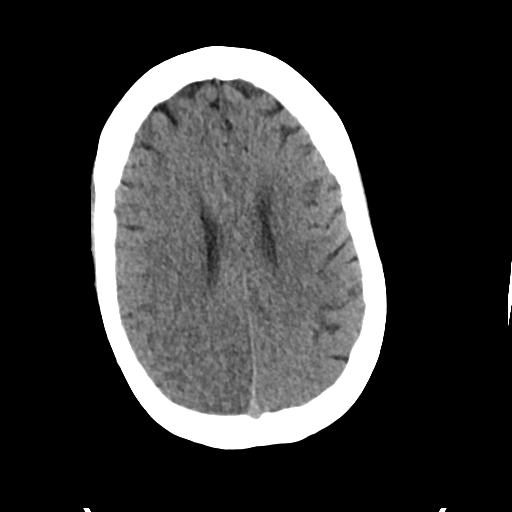
[im 22/36  bone]
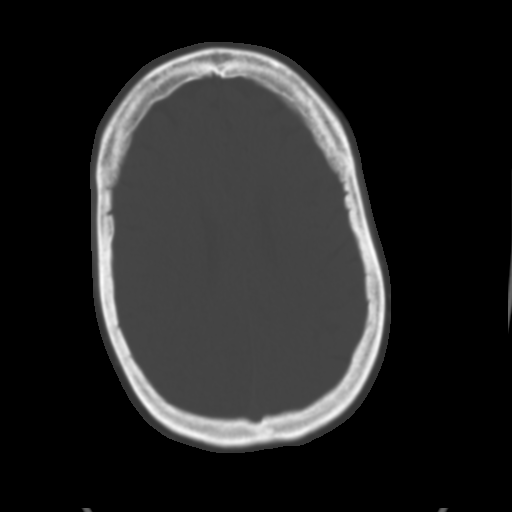
[im 27/36  brain]
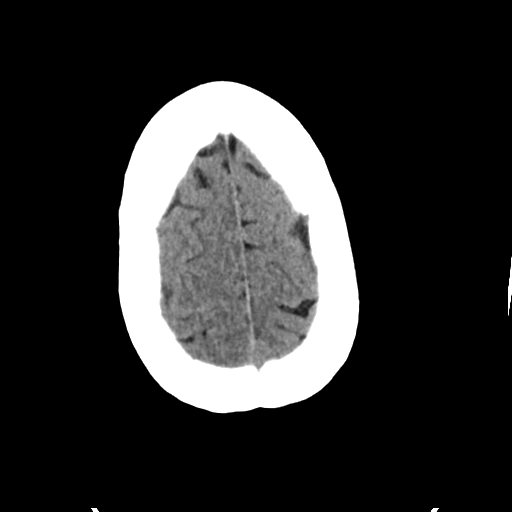
[im 31/36  brain]
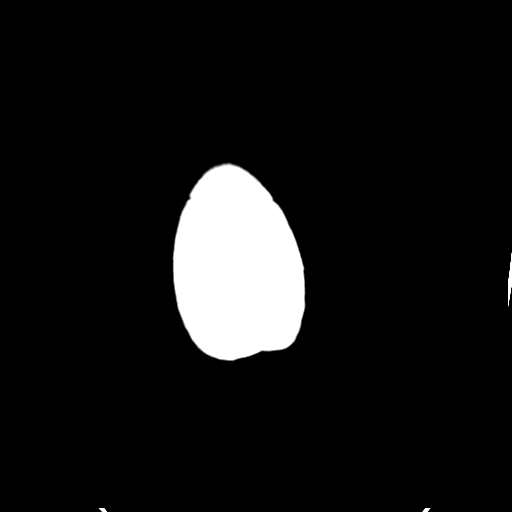

[Series 4: head bone · axial · 0.44mm/px · z∈[+1214,+1250]mm · 3 of 89 slices shown]
[im 9/89  bone]
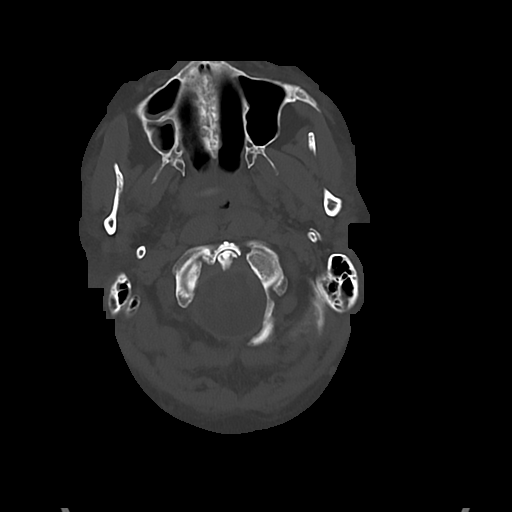
[im 18/89  bone]
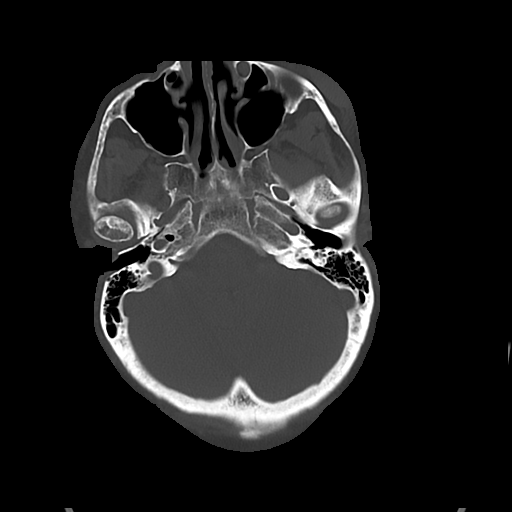
[im 27/89  bone]
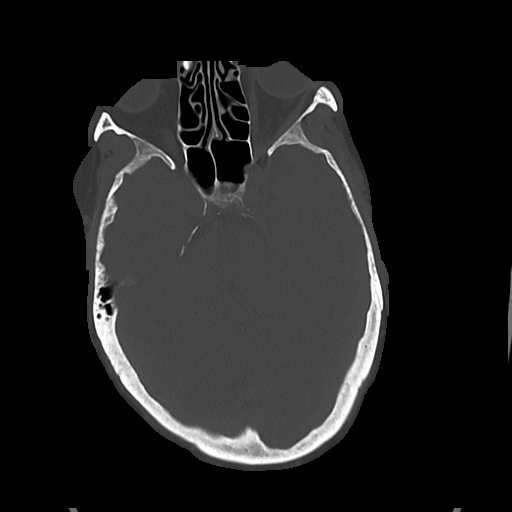

[Series 5: cor soft · coronal · 0.31mm/px · 3 of 72 slices shown]
[im 24/72  brain]
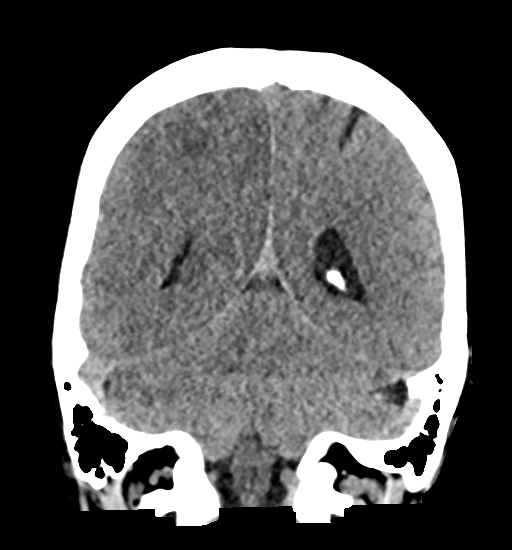
[im 32/72  brain]
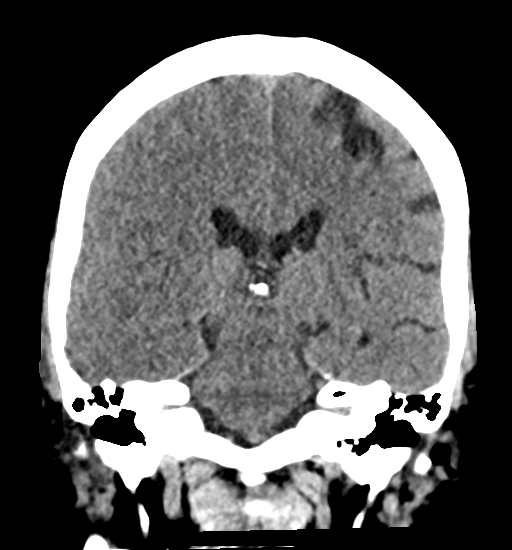
[im 40/72  brain]
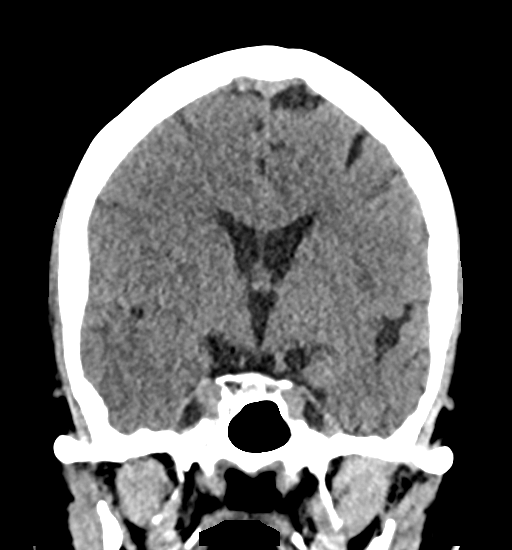

[Series 6: sag soft · sagittal · 0.34mm/px · 3 of 51 slices shown]
[im 17/51  brain]
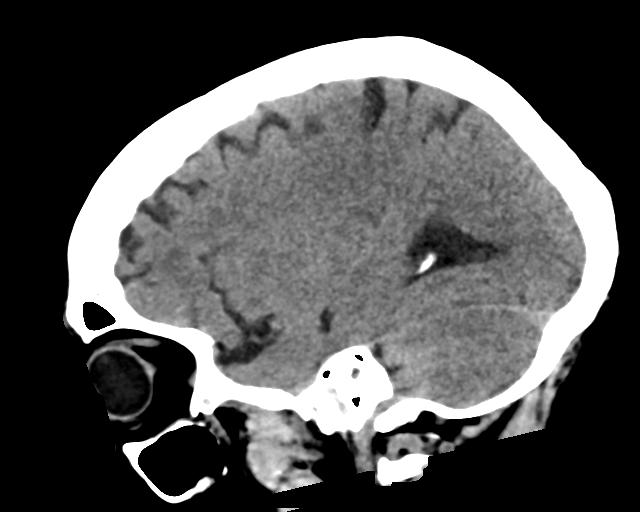
[im 26/51  brain]
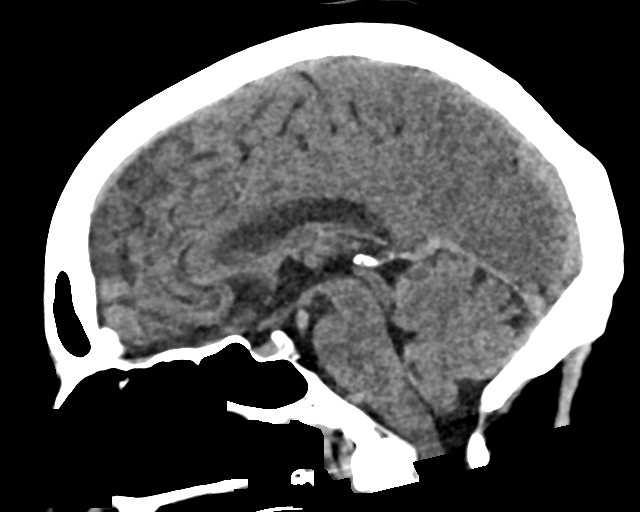
[im 34/51  brain]
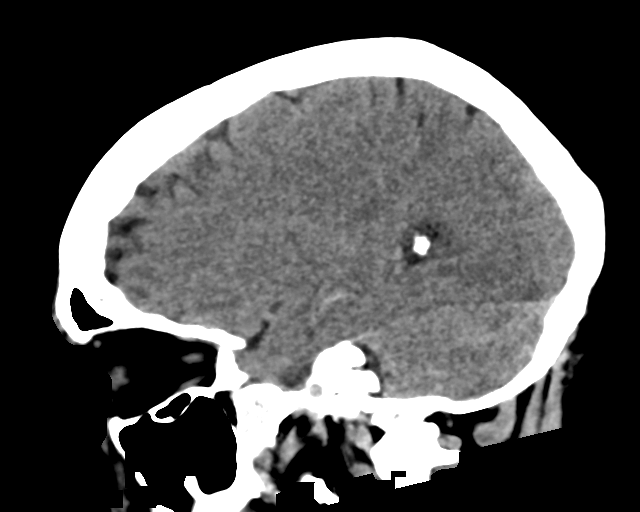

[16 of 47 positions shown; findings below may reference images not displayed]

FINDINGS: Brain: No evidence of acute hemorrhage. There is slight sulcal
effacement and loss of gray-white differentiation involving the
right parietal, temporal and occipital lobes. There is no subdural
or extra-axial collection. No hydrocephalus. Basilar cisterns are
patent. There is no midline shift.

Vascular: Possible hyperdense right MCA.

Skull: No fracture or focal lesion.

Sinuses/Orbits: Bilateral cataract resection. No acute findings.
Slight mucosal thickening of right maxillary sinus.

Other: Hyperdense soft tissue structure inferior to the left orbit
suspicious for hematoma.
IMPRESSION: 1. Sulcal effacement and loss of gray-white differentiation
involving the right parietal, temporal, and occipital lobes
suspicious for acute right MCA infarct. Possible hyperdense right
MCA. Consider CTA. No evidence of acute hemorrhage.
2. Hyperdense soft tissue structure inferior to the left orbit
suspicious for hematoma.

These results were called by telephone at the time of interpretation
on 05/14/2020 at [DATE] to Dr MAMUNI LHAMO , who verbally
acknowledged these results.

## 2021-11-04 IMAGING — DX DG CHEST 1V PORT
1 series · 1 of 1 positions shown · non-contrast
Comparison: None.

CLINICAL DATA: Post arrest

EXAM:
PORTABLE CHEST 1 VIEW

[chest ap]
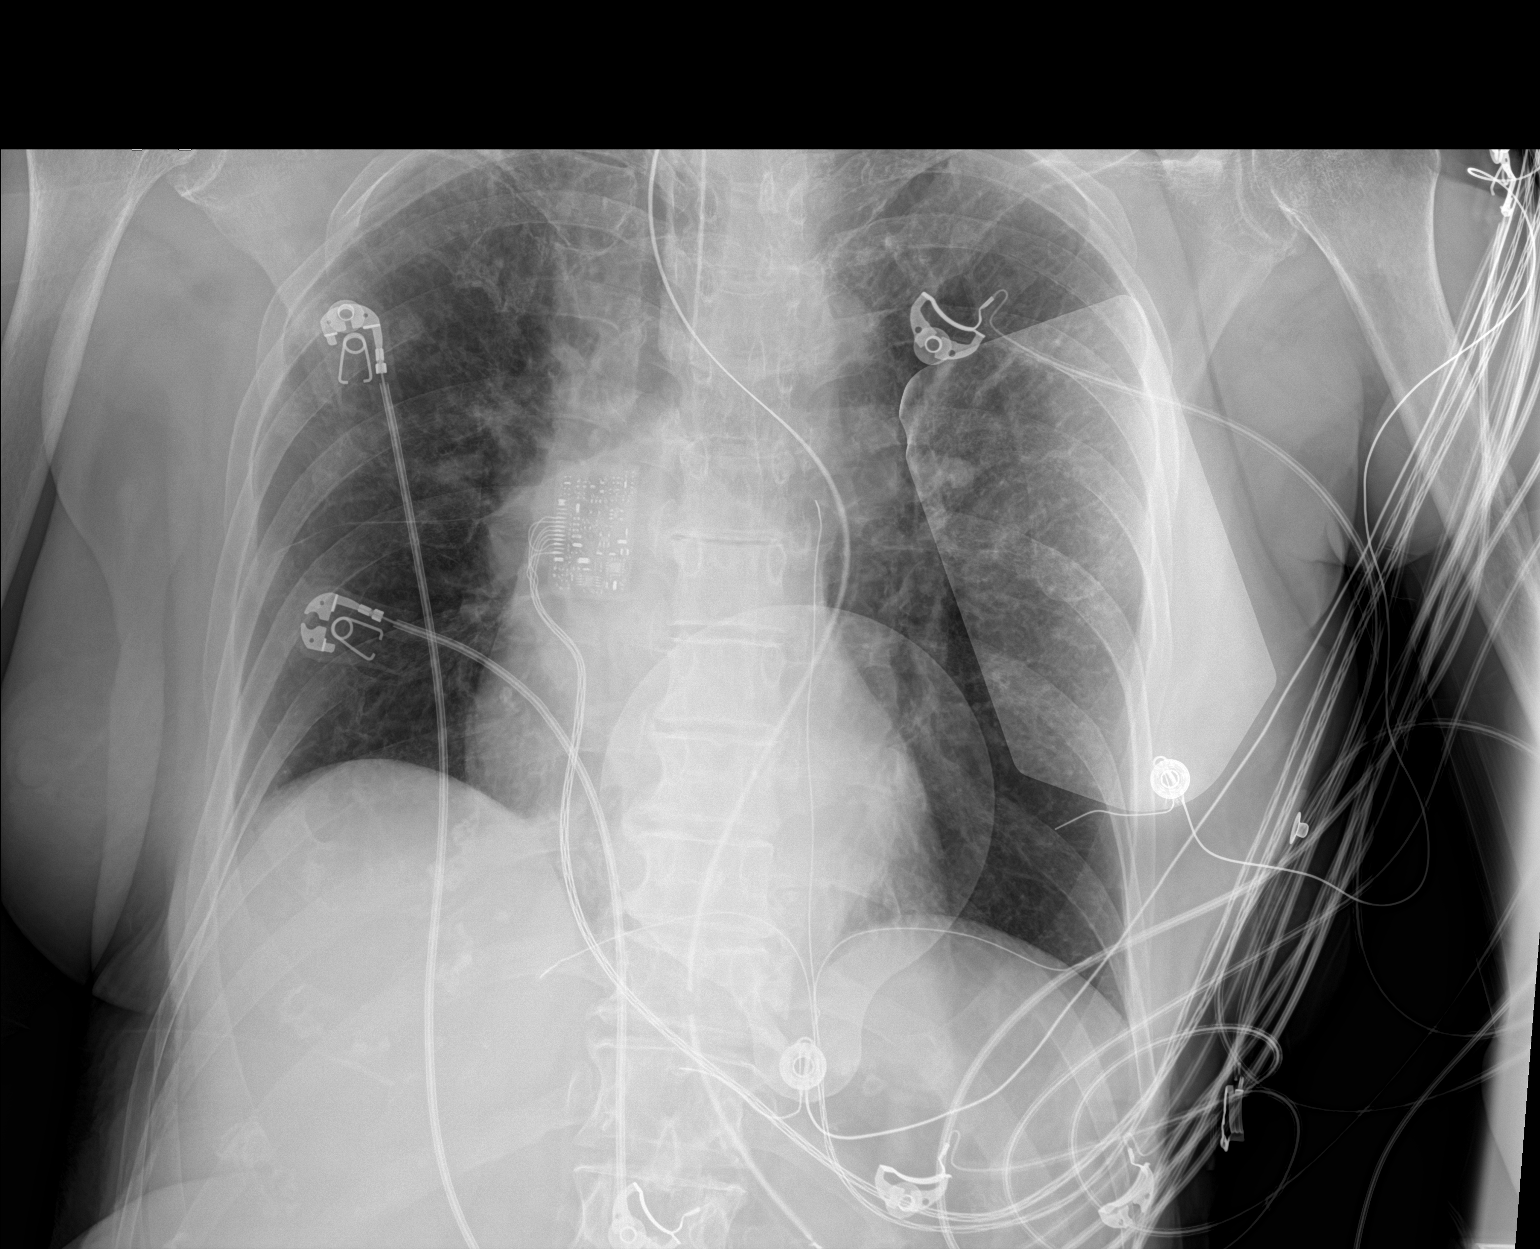

[1 of 1 positions shown; findings below may reference images not displayed]

FINDINGS: The heart size and mediastinal contours are within normal limits. ET
tube is 1.5 cm above the carina. NG tube is seen within the proximal
stomach with the side hole likely just at the GE junction. Both
lungs are clear. The visualized skeletal structures are
unremarkable.
IMPRESSION: No active disease.

ETT in satisfactory position.

NG tube could be advanced
